# Patient Record
Sex: Female | Born: 1935 | Hispanic: No | Marital: Married | Smoking: Never smoker
Health system: Southern US, Community
[De-identification: ages and names within clinical notes are randomized; demographics above are authoritative.]

## PROBLEM LIST (undated history)

## (undated) DIAGNOSIS — I10 Essential (primary) hypertension: Secondary | ICD-10-CM

## (undated) HISTORY — PX: OTHER SURGICAL HISTORY: SHX169

## (undated) HISTORY — PX: EYE SURGERY: SHX253

---

## 2010-04-23 ENCOUNTER — Encounter
Admission: RE | Admit: 2010-04-23 | Discharge: 2010-04-23 | Payer: Self-pay | Source: Home / Self Care | Attending: Specialist | Admitting: Specialist

## 2015-05-28 DIAGNOSIS — H409 Unspecified glaucoma: Secondary | ICD-10-CM | POA: Insufficient documentation

## 2016-05-26 DIAGNOSIS — E7800 Pure hypercholesterolemia, unspecified: Secondary | ICD-10-CM | POA: Insufficient documentation

## 2016-05-26 DIAGNOSIS — I10 Essential (primary) hypertension: Secondary | ICD-10-CM | POA: Insufficient documentation

## 2017-07-20 DIAGNOSIS — R519 Headache, unspecified: Secondary | ICD-10-CM | POA: Insufficient documentation

## 2018-03-20 DIAGNOSIS — H401111 Primary open-angle glaucoma, right eye, mild stage: Secondary | ICD-10-CM | POA: Insufficient documentation

## 2018-03-20 DIAGNOSIS — H401123 Primary open-angle glaucoma, left eye, severe stage: Secondary | ICD-10-CM | POA: Insufficient documentation

## 2018-03-21 ENCOUNTER — Encounter (HOSPITAL_COMMUNITY): Payer: Self-pay

## 2018-03-21 ENCOUNTER — Emergency Department (HOSPITAL_COMMUNITY)
Admission: EM | Admit: 2018-03-21 | Discharge: 2018-03-21 | Disposition: A | Discharge status: Home / Self Care | Payer: Medicare Other | Attending: Emergency Medicine | Admitting: Emergency Medicine

## 2018-03-21 ENCOUNTER — Emergency Department (HOSPITAL_COMMUNITY): Payer: Medicare Other

## 2018-03-21 ENCOUNTER — Other Ambulatory Visit: Payer: Self-pay

## 2018-03-21 DIAGNOSIS — Y939 Activity, unspecified: Secondary | ICD-10-CM | POA: Diagnosis not present

## 2018-03-21 DIAGNOSIS — S4991XA Unspecified injury of right shoulder and upper arm, initial encounter: Secondary | ICD-10-CM | POA: Diagnosis present

## 2018-03-21 DIAGNOSIS — I10 Essential (primary) hypertension: Secondary | ICD-10-CM | POA: Insufficient documentation

## 2018-03-21 DIAGNOSIS — S42291A Other displaced fracture of upper end of right humerus, initial encounter for closed fracture: Secondary | ICD-10-CM

## 2018-03-21 DIAGNOSIS — Y999 Unspecified external cause status: Secondary | ICD-10-CM | POA: Insufficient documentation

## 2018-03-21 DIAGNOSIS — W010XXA Fall on same level from slipping, tripping and stumbling without subsequent striking against object, initial encounter: Secondary | ICD-10-CM | POA: Insufficient documentation

## 2018-03-21 DIAGNOSIS — S42201A Unspecified fracture of upper end of right humerus, initial encounter for closed fracture: Secondary | ICD-10-CM | POA: Insufficient documentation

## 2018-03-21 DIAGNOSIS — Y929 Unspecified place or not applicable: Secondary | ICD-10-CM | POA: Diagnosis not present

## 2018-03-21 HISTORY — DX: Essential (primary) hypertension: I10

## 2018-03-21 MED ORDER — MORPHINE SULFATE 15 MG PO TABS: 7.5000 mg | ORAL_TABLET | ORAL | 0 refills | Status: DC | PRN

## 2018-03-21 NOTE — ED Triage Notes (Signed)
Patient tripped and fell 3 days ago.Patient went to an UC yesterday with c/o right shoulder pain. Patient had an x-ray and the family was informed that she had a fracture to the right shoulder. Patient's family was told to bring the patient to the ED due to pain. Patient currently has a right arm sling.

## 2018-03-21 NOTE — Discharge Instructions (Signed)
Take tylenol 1000mg(2 extra strength) four times a day.  ° °Then take the pain medicine if you feel like you need it. Narcotics do not help with the pain, they only make you care about it less.  You can become addicted to this, people may break into your house to steal it.  It will constipate you.  If you drive under the influence of this medicine you can get a DUI.   ° °

## 2018-03-21 NOTE — ED Provider Notes (Signed)
[facility]-EMERGENCY DEPT Provider Note   CSN: [id number] Arrival date & time: 03/21/18  1205     History   Chief Complaint Chief Complaint  Patient presents with  . Fall  . Shoulder Pain    HPI Jo Brown is a 84 y.o. female.  84 yo F with a cc of a fall.  Tripped over something on the ground and landed on her right shoulder.  Occurred 3 days ago.  Pain and swelling since.  Went to urgent care today with proximal humeral fx and sent here for evaluation.   The history is provided by the patient. A language interpreter was used.  Fall  This is a new problem. The current episode started 2 days ago. The problem occurs constantly. The problem has not changed since onset.Pertinent negatives include no chest pain, no abdominal pain, no headaches and no shortness of breath. Nothing relieves the symptoms. She has tried nothing for the symptoms. The treatment provided no relief.  Shoulder Pain      Past Medical History:  Diagnosis Date  . Hypertension     There are no active problems to display for this patient.   Past Surgical History:  Procedure Laterality Date  . EYE SURGERY       OB History   None      Home Medications    Prior to Admission medications   Medication Sig Start Date End Date Taking? Authorizing Provider  morphine (MSIR) 15 MG tablet Take 0.5 tablets (7.5 mg total) by mouth every 4 (four) hours as needed for severe pain. 03/21/18   [name], DO    Family History History reviewed. No pertinent family history.  Social History Social History   Tobacco Use  . Smoking status: Never Smoker  . Smokeless tobacco: Never Used  Substance Use Topics  . Alcohol use: Never    Frequency: Never  . Drug use: Never     Allergies   Patient has no known allergies.   Review of Systems Review of Systems  Constitutional: Negative for chills and fever.  HENT: Negative for congestion and rhinorrhea.   Eyes: Negative for redness and  visual disturbance.  Respiratory: Negative for shortness of breath and wheezing.   Cardiovascular: Negative for chest pain and palpitations.  Gastrointestinal: Negative for abdominal pain, nausea and vomiting.  Genitourinary: Negative for dysuria and urgency.  Musculoskeletal: Positive for arthralgias and myalgias.  Skin: Negative for pallor and wound.  Neurological: Negative for dizziness and headaches.     Physical Exam Updated Vital Signs BP (!) 145/81   Pulse 83   Temp 98.4 F (36.9 C) (Oral)   Resp 17   Ht 5\' 3"  (1.6 m)   Wt 59 kg   SpO2 100%   BMI 23.03 kg/m   Physical Exam  Constitutional: She is oriented to person, place, and time. She appears well-developed and well-nourished. No distress.  HENT:  Head: Normocephalic and atraumatic.  Eyes: Pupils are equal, round, and reactive to light. EOM are normal.  Neck: Normal range of motion. Neck supple.  Cardiovascular: Normal rate and regular rhythm. Exam reveals no gallop and no friction rub.  No murmur heard. Pulmonary/Chest: Effort normal. She has no wheezes. She has no rales.  Abdominal: Soft. She exhibits no distension. There is no tenderness.  Musculoskeletal: She exhibits edema and tenderness.  Edema and bruising to the right arm down to the forearm.  Full range of motion of the elbow pain at the proximal humerus.  Pulse motor and sensation is intact distally.  Neurological: She is alert and oriented to person, place, and time.  Skin: Skin is warm and dry. She is not diaphoretic.  Psychiatric: She has a normal mood and affect. Her behavior is normal.  Nursing note and vitals reviewed.    ED Treatments / Results  Labs (all labs ordered are listed, but only abnormal results are displayed) Labs Reviewed - No data to display  EKG None  Radiology Dg Shoulder Right  Result Date: 03/21/2018 CLINICAL DATA:  Tripped and fell 3 days ago, right shoulder pain EXAM: RIGHT SHOULDER - 2+ VIEW COMPARISON:  None.  FINDINGS: There is impacted displaced fracture of the right humeral neck with avulsion of the right greater tuberosity. Also, there is slight subluxation of the right humeral head from the glenoid fossa. Right clavicle appears intact. The ribs that are visualized are intact. The bones are somewhat osteopenic. IMPRESSION: 1. Impacted angulated fracture of the right humeral neck with slight caudal subluxation. 2. Avulsion of the right humeral greater tuberosity. Electronically Signed   By: Dwyane DeePaul  Barry M.D.   On: 03/21/2018 13:26    Procedures Procedures (including critical care time)  Medications Ordered in ED Medications - No data to display   Initial Impression / Assessment and Plan / ED Course  I have reviewed the triage vital signs and the nursing notes.  Pertinent labs & imaging results that were available during my care of the patient were reviewed by me and considered in my medical decision making (see chart for details).     84 yo F with a chief complaint of a proximal humerus fracture.  Plain films viewed by me is possible dislocation of the humeral head will discuss with the orthopedist.  I discussed case with Dr. Despina HickAlusio, he independently reviewed the images and thought that the subluxation was likely due to an intra-articular hematoma.  He recommended a shoulder immobilizer and follow-up in the office.  4:28 PM:  I have discussed the diagnosis/risks/treatment options with the patient and family and believe the pt to be eligible for discharge home to follow-up with Ortho. We also discussed returning to the ED immediately if new or worsening sx occur. We discussed the sx which are most concerning (e.g., sudden worsening pain, fever, inability to tolerate by mouth) that necessitate immediate return. Medications administered to the patient during their visit and any new prescriptions provided to the patient are listed below.  Medications given during this visit Medications - No data to  display    The patient appears reasonably screen and/or stabilized for discharge and I doubt any other medical condition or other [facility] requiring further screening, evaluation, or treatment in the ED at this time prior to discharge.    Final Clinical Impressions(s) / ED Diagnoses   Final diagnoses:  Other closed displaced fracture of proximal end of right humerus, initial encounter    ED Discharge Orders         Ordered    morphine (MSIR) 15 MG tablet  Every 4 hours PRN     03/21/18 1627           Melene PlanFloyd, Zackary Mckeone, DO 03/21/18 1628

## 2018-04-05 DIAGNOSIS — M25511 Pain in right shoulder: Secondary | ICD-10-CM | POA: Insufficient documentation

## 2018-04-20 ENCOUNTER — Other Ambulatory Visit: Payer: Self-pay

## 2018-04-20 ENCOUNTER — Ambulatory Visit: Payer: Medicare Other | Attending: Orthopedic Surgery

## 2018-04-20 DIAGNOSIS — M25511 Pain in right shoulder: Secondary | ICD-10-CM | POA: Diagnosis present

## 2018-04-20 DIAGNOSIS — M6281 Muscle weakness (generalized): Secondary | ICD-10-CM | POA: Diagnosis present

## 2018-04-20 DIAGNOSIS — M25611 Stiffness of right shoulder, not elsewhere classified: Secondary | ICD-10-CM | POA: Insufficient documentation

## 2018-04-20 NOTE — Therapy (Signed)
[facility] [address] [place], [place], [address] Phone: [phone]   Fax:  [phone]  Physical Therapy Evaluation  Patient Details  Name: Jo Brown MRN: [id number] Date of Birth: 26-Aug-1933 Referring Provider (PT): Duwayne HeckJason Rogers, MD   Encounter Date: 04/20/2018  PT End of Session - 04/20/18 1341    Visit Number  1    Number of Visits  18    Date for PT Re-Evaluation  06/23/18    Authorization Type  MCD    PT Start Time  0133    PT Stop Time  0214    PT Time Calculation (min)  41 min    Activity Tolerance  Patient tolerated treatment well;Patient limited by pain    Behavior During Therapy  [facility] for tasks assessed/performed       History reviewed. No pertinent past medical history.  History reviewed. No pertinent surgical history.  There were no vitals filed for this visit.   Subjective Assessment - 04/20/18 1329    Subjective  RT arm pain.  Due to fracture from fall.    Tripped at home .     Patient is accompained by:  Family member   daughter anbd sister in law   Limitations  Lifting   not using  arm for self care and home tasks   Currently in Pain?  Yes    Pain Score  5     Pain Location  Shoulder    Pain Orientation  Right   prox humerous   Pain Type  --   sub acute   Pain Onset  More than a month ago    Pain Frequency  Constant    Aggravating Factors   moving RT arm    Pain Relieving Factors  rest,  meds          [facility] PT Assessment - 04/20/18 0001      Assessment   Medical Diagnosis  Fx Rt humerous    Referring Provider (PT)  Duwayne HeckJason Rogers, MD    Onset Date/Surgical Date  03/18/18    Hand Dominance  Right    Next MD Visit  04/1018    Prior Therapy  no      Precautions   Precaution Comments  PROM only shoulder      Restrictions   Other Position/Activity Restrictions  no weight bearing      Balance Screen   Has the patient fallen in the past 6 months  Yes      Home Environment   Living  Environment  Private residence    Living Arrangements  Children    Available Help at Discharge  Family    Type of Home  House      Prior Function   Level of Independence  Needs assistance with ADLs;Needs assistance with homemaking    Vocation  Unemployed      Cognition   Overall Cognitive Status  Within Functional Limits for tasks assessed    Area of Impairment  --      ROM / Strength   AROM / PROM / Strength  PROM;AROM      AROM   AROM Assessment Site  Shoulder    Right/Left Shoulder  Left    Left Shoulder Flexion  134 Degrees    Left Shoulder ABduction  145 Degrees    Left Shoulder Internal Rotation  60 Degrees    Left Shoulder External Rotation  90 Degrees    Left Shoulder Horizontal ABduction  25 Degrees    Left Shoulder Horizontal ADduction  103 Degrees      PROM   Overall PROM Comments  RT elbow flexion 135  ext - 4-    PROM Assessment Site  Shoulder    Right/Left Shoulder  Right    Right Shoulder Extension  25 Degrees    Right Shoulder Flexion  90 Degrees    Right Shoulder ABduction  70 Degrees    Right Shoulder Internal Rotation  70 Degrees    Right Shoulder External Rotation  25 Degrees    Right Shoulder Horizontal ABduction  5 Degrees    Right Shoulder Horizontal  ADduction  40 Degrees                Objective measurements completed on examination: See above findings.              PT Education - 04/20/18 1329    Education Details  POC, elevation on pillows and STW , active elbow ROM,       Person(s) Educated  Patient;Caregiver(s)    Methods  Explanation;Demonstration;Verbal cues;Tactile cues    Comprehension  Verbalized understanding;Returned demonstration       PT Short Term Goals - 04/20/18 1418      PT SHORT TERM GOAL #1   Title  She will be independnet with initial HEP    Baseline  no program and only PROM    Time  2    Period  Weeks    Status  New      PT SHORT TERM GOAL #2   Title  Passive flexion to 105 degrees      Baseline  90 degreees at eval    Time  2    Period  Weeks    Status  New      PT SHORT TERM GOAL #3   Title  passive abduction  to 95 degrees     Baseline  70 at eval    Time  2    Period  Weeks    Status  New        PT Long Term Goals - 04/20/18 1420      PT LONG TERM GOAL #1   Title  She will be independent with all HEP issued    Baseline  independnet with iniitial HEP    Time  8    Period  Weeks    Status  New      PT LONG TERM GOAL #2   Title  She will be able to do all self care with RT are as normal with mild pain    Baseline  unable to do self care with RT arm    Time  8    Period  Weeks    Status  New      PT LONG TERM GOAL #3   Title  She will return to  home tasks with cooking and cleaning with light to moderate tasks with 3/10 max pain    Baseline  Not doing home tasks due to pain    Time  8    Period  Weeks    Status  New      PT LONG TERM GOAL #4   Title  She will demo active ROM  with 3/10 max pain  over hea d with lifting 3 pounds to cabinet    Baseline  unable to lift weight    Time  8    Period  Weeks  Status  New             Plan - 04/20/18 1413    Clinical Impression Statement  Ms Jo CollinsLim presents with RT shoulder pain and limited motion due to fracture from trip and fall . She is limited to PROM.  She should progress with skilled PT return to use of Rt arm for self care and home tasks    Clinical Presentation  Stable    Clinical Decision Making  Low    Rehab Potential  Good    PT Frequency  --   3 visits for 2 weeks   PT Duration  2 weeks   then 2x/week for 6 weeks   PT Treatment/Interventions  Passive range of motion;Taping;Manual techniques;Patient/family education;Therapeutic exercise;Therapeutic activities;Cryotherapy;Moist Heat    PT Next Visit Plan  Review HEp , manul for STW and ROM PROM until MD clears for different ROM. modalities as needed    PT Home Exercise Plan  Support arm at rest above heart if able ,   active elbow FLex  and ext.   family STW to tolerance    Consulted and Agree with Plan of Care  Family member/caregiver;Patient    Family Member Consulted  siter and sister in law       Patient will benefit from skilled therapeutic intervention in order to improve the following deficits and impairments:  Pain, Impaired UE functional use, Decreased strength, Decreased range of motion, Increased muscle spasms  Visit Diagnosis: Right shoulder pain, unspecified chronicity  Stiffness of right shoulder, not elsewhere classified  Muscle weakness (generalized)     Problem List There are no active problems to display for this patient.   Caprice RedChasse, Aviyanna Colbaugh M  PT 04/20/2018, 2:24 PM  [facility] [address] [place], [place], [address] Phone: [phone]   Fax:  [phone]  Name: Jo Brown MRN: [id number] Date of Birth: 07-15-1933

## 2018-05-03 ENCOUNTER — Encounter: Payer: Self-pay | Admitting: Physical Therapy

## 2018-05-03 ENCOUNTER — Ambulatory Visit: Payer: Medicare Other | Admitting: Physical Therapy

## 2018-05-03 DIAGNOSIS — M6281 Muscle weakness (generalized): Secondary | ICD-10-CM

## 2018-05-03 DIAGNOSIS — M25511 Pain in right shoulder: Secondary | ICD-10-CM

## 2018-05-03 DIAGNOSIS — M25611 Stiffness of right shoulder, not elsewhere classified: Secondary | ICD-10-CM

## 2018-05-03 NOTE — Therapy (Signed)
[facility] [address] [place], [place], [address] Phone: [phone]   Fax:  [phone]  Physical Therapy Treatment  Patient Details  Name: Jo MillerHye Sook Zarcone MRN: [id number] Date of Birth: 1934-01-03 Referring Provider (PT): Duwayne HeckJason Rogers, MD   Encounter Date: 05/03/2018  PT End of Session - 05/03/18 1020    Visit Number  2    Number of Visits  18    Date for PT Re-Evaluation  06/23/18    Authorization Type  medicare/medicaid     PT Start Time  1015    PT Stop Time  1055    PT Time Calculation (min)  40 min       Past Medical History:  Diagnosis Date  . Hypertension     Past Surgical History:  Procedure Laterality Date  . EYE SURGERY      There were no vitals filed for this visit.  Subjective Assessment - 05/03/18 1019    Subjective  Pain when I move    Patient is accompained by:  Family member;Interpreter    Currently in Pain?  Yes    Pain Score  4    with movement    Pain Location  Arm    Pain Orientation  Right;Proximal    Aggravating Factors   moving    Pain Relieving Factors  rest         [facility] PT Assessment - 05/03/18 0001      PROM   Right Shoulder Flexion  95 Degrees    Right Shoulder ABduction  75 Degrees    Right Shoulder Internal Rotation  70 Degrees    Right Shoulder External Rotation  15 Degrees                   [facility] Adult PT Treatment/Exercise - 05/03/18 0001      Exercises   Exercises  Shoulder      Shoulder Exercises: Seated   Retraction  10 reps    Other Seated Exercises  shoulder rolls     Other Seated Exercises  AROM bicep curls , towel grip x 10      Shoulder Exercises: Stretch   Other Shoulder Stretches  upper trap stretch for right       Manual Therapy   Manual Therapy  Soft tissue mobilization;Joint mobilization;Passive ROM    Joint Mobilization  gentle oscillations A/P, P/A and inferior for relaxation    Soft tissue mobilization  IASTM to upper arm  anterior, lateral, posterior     Passive ROM  Flexion , abduction, ER , IR                PT Short Term Goals - 04/20/18 1418      PT SHORT TERM GOAL #1   Title  She will be independnet with initial HEP    Baseline  no program and only PROM    Time  2    Period  Weeks    Status  New      PT SHORT TERM GOAL #2   Title  Passive flexion to 105 degrees     Baseline  90 degreees at eval    Time  2    Period  Weeks    Status  New      PT SHORT TERM GOAL #3   Title  passive abduction  to 95 degrees     Baseline  70 at eval    Time  2  Period  Weeks    Status  New        PT Long Term Goals - 04/20/18 1420      PT LONG TERM GOAL #1   Title  She will be independent with all HEP issued    Baseline  independnet with iniitial HEP    Time  8    Period  Weeks    Status  New      PT LONG TERM GOAL #2   Title  She will be able to do all self care with RT are as normal with mild pain    Baseline  unable to do self care with RT arm    Time  8    Period  Weeks    Status  New      PT LONG TERM GOAL #3   Title  She will return to  home tasks with cooking and cleaning with light to moderate tasks with 3/10 max pain    Baseline  Not doing home tasks due to pain    Time  8    Period  Weeks    Status  New      PT LONG TERM GOAL #4   Title  She will demo active ROM  with 3/10 max pain  over hea d with lifting 3 pounds to cabinet    Baseline  unable to lift weight    Time  8    Period  Weeks    Status  New            Plan - 05/03/18 1107    Clinical Impression Statement  Pt reports seeing MD and he said she was healing well. She does not have any written instructions for PT at this time. Began with shoulder rolls and upper trap stretches. Performed gentle GHJ oscillations to decrease tension and promote relaxation for PROM. PROM slighty improved for flexion and abduction. ER very painful at end range. IASTm and soft tissue work to upper arm at end of session for  pain relief. Encouraged HMP for tissue relaxation at home. Sh reports difficulty with gripping at home. Began towel squeezes.     PT Next Visit Plan  Review HEp , manul for STW and ROM PROM until MD clears for different ROM. modalities as needed    PT Home Exercise Plan  Support arm at rest above heart if able ,   active elbow FLex and ext.   family STW to tolerance    Consulted and Agree with Plan of Care  Family member/caregiver;Patient    Family Member Consulted  siter and sister in law       Patient will benefit from skilled therapeutic intervention in order to improve the following deficits and impairments:  Pain, Impaired UE functional use, Decreased strength, Decreased range of motion, Increased muscle spasms  Visit Diagnosis: Right shoulder pain, unspecified chronicity  Stiffness of right shoulder, not elsewhere classified  Muscle weakness (generalized)     Problem List There are no active problems to display for this patient.   Jo Brown, [name] 05/03/2018, 11:22 AM  [facility] [address] [place], [place], [address] Phone: [phone]   Fax:  [phone]  Name: Jo Brown MRN: [id number] Date of Birth: 1933/06/30

## 2018-05-05 ENCOUNTER — Encounter: Payer: Self-pay | Admitting: Physical Therapy

## 2018-05-05 ENCOUNTER — Ambulatory Visit: Payer: Medicare Other | Admitting: Physical Therapy

## 2018-05-05 DIAGNOSIS — M6281 Muscle weakness (generalized): Secondary | ICD-10-CM

## 2018-05-05 DIAGNOSIS — M25611 Stiffness of right shoulder, not elsewhere classified: Secondary | ICD-10-CM

## 2018-05-05 DIAGNOSIS — M25511 Pain in right shoulder: Secondary | ICD-10-CM | POA: Diagnosis not present

## 2018-05-05 NOTE — Therapy (Signed)
[facility] [address] [place], [place], [address] Phone: [phone]   Fax:  [phone]  Physical Therapy Treatment  Patient Details  Name: Jo Brown MRN: [id number] Date of Birth: Jan 28, 1934 Referring Provider (PT): Duwayne Heck, MD   Encounter Date: 05/05/2018  PT End of Session - 05/05/18 1112    Visit Number  3    Number of Visits  18    Date for PT Re-Evaluation  06/23/18    Authorization Type  medicare/medicaid     PT Start Time  1015    PT Stop Time  1055    PT Time Calculation (min)  40 min    Activity Tolerance  Patient tolerated treatment well;Patient limited by pain    Behavior During Therapy  [facility] for tasks assessed/performed       Past Medical History:  Diagnosis Date  . Hypertension     Past Surgical History:  Procedure Laterality Date  . EYE SURGERY      There were no vitals filed for this visit.  Subjective Assessment - 05/05/18 1102    Subjective  Pt reports feeling better after last tx and has no pain today.    Currently in Pain?  No/denies         [facility] PT Assessment - 05/05/18 0001      PROM   Right Shoulder Flexion  110 Degrees    Right Shoulder ABduction  95 Degrees                   [facility] Adult PT Treatment/Exercise - 05/05/18 0001      Shoulder Exercises: Stretch   Other Shoulder Stretches  upper trap stretch for right       Manual Therapy   Manual Therapy  Soft tissue mobilization;Passive ROM;Joint mobilization    Joint Mobilization  gentle oscillations for relaxation    Soft tissue mobilization  IASTM to upper arm anterior, lateral, posterior     Passive ROM  Flexion , abduction, ER , IR                PT Short Term Goals - 04/20/18 1418      PT SHORT TERM GOAL #1   Title  She will be independnet with initial HEP    Baseline  no program and only PROM    Time  2    Period  Weeks    Status  New      PT SHORT TERM GOAL #2   Title  Passive  flexion to 105 degrees     Baseline  90 degreees at eval    Time  2    Period  Weeks    Status  New      PT SHORT TERM GOAL #3   Title  passive abduction  to 95 degrees     Baseline  70 at eval    Time  2    Period  Weeks    Status  New        PT Long Term Goals - 04/20/18 1420      PT LONG TERM GOAL #1   Title  She will be independent with all HEP issued    Baseline  independnet with iniitial HEP    Time  8    Period  Weeks    Status  New      PT LONG TERM GOAL #2   Title  She will be able to do all self  care with RT are as normal with mild pain    Baseline  unable to do self care with RT arm    Time  8    Period  Weeks    Status  New      PT LONG TERM GOAL #3   Title  She will return to  home tasks with cooking and cleaning with light to moderate tasks with 3/10 max pain    Baseline  Not doing home tasks due to pain    Time  8    Period  Weeks    Status  New      PT LONG TERM GOAL #4   Title  She will demo active ROM  with 3/10 max pain  over hea d with lifting 3 pounds to cabinet    Baseline  unable to lift weight    Time  8    Period  Weeks    Status  New            Plan - 05/05/18 1205    Clinical Impression Statement  Pt reports feeling better after last treatment and has no pain today. Began with upper trap stretching to relax before PROM. Gentle oscillations were performed during PROM to relax and decrease muscle guarding. Was able to increase shoulder flexion from 95 to 110 degrees and abduction from 75 to 95 degrees in comparison to last visit. Reminded pt and siste that she should still not be actively using her right arm. Pt stated feeling better after tx and is progressing.     PT Treatment/Interventions  Passive range of motion;Taping;Manual techniques;Patient/family education;Therapeutic exercise;Therapeutic activities;Cryotherapy;Moist Heat    PT Next Visit Plan  Review HEp , manul for STW and ROM PROM until MD clears for different ROM.  modalities as needed    PT Home Exercise Plan  Support arm at rest above heart if able ,   active elbow FLex and ext.   family STW to tolerance    Consulted and Agree with Plan of Care  Family member/caregiver;Patient    Family Member Consulted  siter and sister in law      During this treatment session, the therapist was present, participating in and directing the treatment.  Patient will benefit from skilled therapeutic intervention in order to improve the following deficits and impairments:  Pain, Impaired UE functional use, Decreased strength, Decreased range of motion, Increased muscle spasms  Visit Diagnosis: Right shoulder pain, unspecified chronicity  Stiffness of right shoulder, not elsewhere classified  Muscle weakness (generalized)     Problem List There are no active problems to display for this patient.   Royetta AsalHeather Radie Berges, SPTA 05/05/2018, 12:17 PM   Jannette SpannerJessica Donoho, PTA 05/05/18 12:31 PM Phone: [phone] Fax: [phone]  [facility] [address] [place], [place], [address] Phone: [phone]   Fax:  [phone]  Name: Percell MillerHye Sook Holtsclaw MRN: [id number] Date of Birth: [birth year]

## 2018-05-08 ENCOUNTER — Other Ambulatory Visit: Payer: Self-pay

## 2018-05-08 ENCOUNTER — Ambulatory Visit: Payer: Medicare Other | Admitting: Physical Therapy

## 2018-05-08 ENCOUNTER — Encounter: Payer: Self-pay | Admitting: Physical Therapy

## 2018-05-08 DIAGNOSIS — M25511 Pain in right shoulder: Secondary | ICD-10-CM

## 2018-05-08 DIAGNOSIS — M25611 Stiffness of right shoulder, not elsewhere classified: Secondary | ICD-10-CM

## 2018-05-08 DIAGNOSIS — M6281 Muscle weakness (generalized): Secondary | ICD-10-CM

## 2018-05-08 NOTE — Therapy (Signed)
[facility] [address] [place], [place], [address] Phone: [phone]   Fax:  [phone]  Physical Therapy Treatment  Patient Details  Name: Jo MillerHye Sook Brown MRN: [id number] Date of Birth: [birth year] Referring Provider (PT): Duwayne HeckJason Rogers, MD   Encounter Date: 05/08/2018  PT End of Session - 05/08/18 0757    Visit Number  4    Number of Visits  18    Date for PT Re-Evaluation  06/23/18    Authorization Type  medicare/medicaid     PT Start Time  0800    PT Stop Time  0840    PT Time Calculation (min)  40 min    Activity Tolerance  Patient tolerated treatment well    Behavior During Therapy  [facility] for tasks assessed/performed       Past Medical History:  Diagnosis Date  . Hypertension     Past Surgical History:  Procedure Laterality Date  . EYE SURGERY      There were no vitals filed for this visit.  Subjective Assessment - 05/08/18 0806    Subjective  Pt  is not taking as much medicine so she is hurting more.    Limitations  Lifting    Currently in Pain?  Yes   at rest only with movement   Pain Score  5    with movement and no medicine   Pain Location  Arm    Pain Orientation  Right;Proximal    Pain Descriptors / Indicators  Sore;Heaviness    Pain Type  --   sub acute   Pain Onset  More than a month ago    Pain Frequency  Intermittent    Aggravating Factors   when movinig arm         [facility] PT Assessment - 05/08/18 0833      PROM   Overall PROM   Deficits    Right Shoulder Flexion  130 Degrees    Right Shoulder ABduction  101 Degrees   ERP   Right Shoulder Internal Rotation  70 Degrees   45 degree abduction   Right Shoulder External Rotation  30 Degrees   ERP                  [facility] Adult PT Treatment/Exercise - 05/08/18 0811      Shoulder Exercises: Stretch   Other Shoulder Stretches  upper trap stretch for right       Manual Therapy   Manual Therapy  Soft tissue mobilization;Passive  ROM;Joint mobilization    Joint Mobilization  gentle oscillations for relaxation    Soft tissue mobilization  IASTM to upper arm anterior, lateral, posterior , right pectoral, latissimus, teres major    Passive ROM  Flexion , abduction, ER , IR                PT Short Term Goals - 05/08/18 [id number]      PT SHORT TERM GOAL #1   Title  She will be independnet with initial HEP    Baseline  no program and only PROM. MD called 05-07-18 to see get permission to proceed with AAROM    Time  2    Period  Weeks    Status  On-going      PT SHORT TERM GOAL #2   Title  Passive flexion to 105 degrees     Baseline  90 degreees at eval   05-07-18 PROM to 130 flex, 101 abd    Period  Weeks    Status  Achieved     PT SHORT TERM GOAL #3   Title  passive abduction  to 95 degrees     Baseline  101 05-07-18    Time  2    Period  Weeks    Status  Achieved        PT Long Term Goals - 04/20/18 1420      PT LONG TERM GOAL #1   Title  She will be independent with all HEP issued    Baseline  independnet with iniitial HEP    Time  8    Period  Weeks    Status  New      PT LONG TERM GOAL #2   Title  She will be able to do all self care with RT are as normal with mild pain    Baseline  unable to do self care with RT arm    Time  8    Period  Weeks    Status  New      PT LONG TERM GOAL #3   Title  She will return to  home tasks with cooking and cleaning with light to moderate tasks with 3/10 max pain    Baseline  Not doing home tasks due to pain    Time  8    Period  Weeks    Status  New      PT LONG TERM GOAL #4   Title  She will demo active ROM  with 3/10 max pain  over hea d with lifting 3 pounds to cabinet    Baseline  unable to lift weight    Time  8    Period  Weeks    Status  New            Plan - 05/08/18 0848    Clinical Impression Statement  Pt reports feeling 5/10 pain today with movement but no pain at rest.  Pt is not taking medication.  Pt with increased muscle  gaurding iniitally.  Shouder flex PROM 130 flex. abd  101 and  IR 70 at 45 degree abd and 30 degrees ER with 45 degree shld abd.  worked on soft tisssue mobilization of latissimus, pectoral . traps and peri scapular musculature.  Achieved STG #2 and # 3    Rehab Potential  Good    PT Treatment/Interventions  Passive range of motion;Taping;Manual techniques;Patient/family education;Therapeutic exercise;Therapeutic activities;Cryotherapy;Moist Heat    PT Next Visit Plan  Review HEp , manul for STW and ROM PROM until MD clears for different ROM. modalities as needed called MD 05-07-18 to fax progression RX    PT Home Exercise Plan  Support arm at rest above heart if able ,   active elbow FLex and ext.   family STW to tolerance    Consulted and Agree with Plan of Care  Family member/caregiver;Patient       Patient will benefit from skilled therapeutic intervention in order to improve the following deficits and impairments:  Pain, Impaired UE functional use, Decreased strength, Decreased range of motion, Increased muscle spasms  Visit Diagnosis: Right shoulder pain, unspecified chronicity  Stiffness of right shoulder, not elsewhere classified  Muscle weakness (generalized)     Problem List There are no active problems to display for this patient.  Garen Lah, PT Certified Exercise Expert for the Aging Adult  05/08/18 9:10 AM Phone: [phone] Fax: [phone]  [facility] [address]  [place], [place], [address] Phone: [phone]   Fax:  [phone]  Name: Jo Brown MRN: [id number] Date of Birth: 08/09/1933

## 2018-05-11 ENCOUNTER — Ambulatory Visit: Payer: Medicare Other | Admitting: Physical Therapy

## 2018-05-11 ENCOUNTER — Encounter: Payer: Self-pay | Admitting: Physical Therapy

## 2018-05-11 DIAGNOSIS — M25611 Stiffness of right shoulder, not elsewhere classified: Secondary | ICD-10-CM

## 2018-05-11 DIAGNOSIS — M25511 Pain in right shoulder: Secondary | ICD-10-CM

## 2018-05-11 DIAGNOSIS — M6281 Muscle weakness (generalized): Secondary | ICD-10-CM

## 2018-05-11 NOTE — Patient Instructions (Addendum)
When Exercising . ALWAYS point your thumb up or out.     Finger Flexors  Keeping right fingertips straight, press putty toward base of palm. Repeat __20__ times. Do __3__ sessions per day. Activity: Squeeze flour sifter, plastic squeeze bottles, [place] baster, juice from fruit.*  AROM: Elbow Flexion / Extension  With left hand palm up, gently bend elbow as far as possible. Then straighten arm as far as possible. Repeat __10__ times per set. Do __3__ sessions per day.  SHOULDER: Flexion On Table  Place hands on table, elbows straight. Move hips away from body. Press hands down into table. Hold _3__ seconds. _10__ reps per set, __3_ sets per day. Posture - Sitting   Sit upright, head facing forward. Try using a roll to support lower back. Keep shoulders relaxed, and avoid rounded back. Keep hips level with knees. Avoid crossing legs for long periods.   PT draw picture of  Left elbow resting table and Active Range of Motion in pain free motion of supination and pronation 10 x  3 x a day                  Copyright  VHI. All rights reserved.   Cryotherapy  ICE 20 mins at most, 3 times a day following exercises.   Cryotherapy means treatment with cold. Ice or gel packs can be used to reduce both pain and swelling. Ice is the most helpful within the first 24 to 48 hours after an injury or flare-up from overusing a muscle or joint. Sprains, strains, spasms, burning pain, shooting pain, and aches can all be eased with ice. Ice can also be used when recovering from surgery. Ice is effective, has very few side effects, and is safe for most people to use. PRECAUTIONS  Ice is not a safe treatment option for people with:  Raynaud phenomenon. This is a condition affecting small blood vessels in the extremities. Exposure to cold may cause your problems to return.  Cold hypersensitivity. There are many forms of cold hypersensitivity, including:  Cold urticaria. Red, itchy  hives appear on the skin when the tissues begin to warm after being iced.  Cold erythema. This is a red, itchy rash caused by exposure to cold.  Cold hemoglobinuria. Red blood cells break down when the tissues begin to warm after being iced. The hemoglobin that carry oxygen are passed into the urine because they cannot combine with blood proteins fast enough.  Numbness or altered sensitivity in the area being iced. If you have any of the following conditions, do not use ice until you have discussed cryotherapy with your caregiver:  Heart conditions, such as arrhythmia, angina, or chronic heart disease.  High blood pressure.  Healing wounds or open skin in the area being iced.  Current infections.  Rheumatoid arthritis.  Poor circulation.  Diabetes. Ice slows the blood flow in the region it is applied. This is beneficial when trying to stop inflamed tissues from spreading irritating chemicals to surrounding tissues. However, if you expose your skin to cold temperatures for too long or without the proper protection, you can damage your skin or nerves. Watch for signs of skin damage due to cold. HOME CARE INSTRUCTIONS Follow these tips to use ice and cold packs safely.  Place a dry or damp towel between the ice and skin. A damp towel will cool the skin more quickly, so you may need to shorten the time that the ice is used.  For a more rapid  response, add gentle compression to the ice.  Ice for no more than 20 minutes at a time. The bonier the area you are icing, the less time it will take to get the benefits of ice.  Check your skin after 5 minutes to make sure there are no signs of a poor response to cold or skin damage.  Rest 20 minutes or more between uses.  Once your skin is numb, you can end your treatment. You can test numbness by very lightly touching your skin. The touch should be so light that you do not see the skin dimple from the pressure of your fingertip. When using  ice, most people will feel these normal sensations in this order: cold, burning, aching, and numbness.  Do not use ice on someone who cannot communicate their responses to pain, such as small children or people with dementia. HOW TO MAKE AN ICE PACK Ice packs are the most common way to use ice therapy. Other methods include ice massage, ice baths, and cryosprays. Muscle creams that cause a cold, tingly feeling do not offer the same benefits that ice offers and should not be used as a substitute unless recommended by your caregiver. To make an ice pack, do one of the following:  Place crushed ice or a bag of frozen vegetables in a sealable plastic bag. Squeeze out the excess air. Place this bag inside another plastic bag. Slide the bag into a pillowcase or place a damp towel between your skin and the bag.  Mix 3 parts water with 1 part rubbing alcohol. Freeze the mixture in a sealable plastic bag. When you remove the mixture from the freezer, it will be slushy. Squeeze out the excess air. Place this bag inside another plastic bag. Slide the bag into a pillowcase or place a damp towel between your skin and the bag. SEEK MEDICAL CARE IF:  You develop white spots on your skin. This may give the skin a blotchy (mottled) appearance.  Your skin turns blue or pale.  Your skin becomes waxy or hard.  Your swelling gets worse. MAKE SURE YOU:   Understand these instructions.  Will watch your condition.  Will get help right away if you are not doing well or get worse. Document Released: 11/30/2010 Document Revised: 08/20/2013 Document Reviewed: 11/30/2010 [facility] Patient Information 2015 [place], [place]. This information is not intended to replace advice given to you by your health care provider. Make sure you discuss any questions you have with your health care provider.  Garen Lah, PT Certified Exercise Expert for the Aging Adult  05/11/18 9:13 AM Phone: [phone] Fax:  [phone]

## 2018-05-11 NOTE — Therapy (Signed)
[facility] [address] [place], [place], [address] Phone: [phone]   Fax:  [phone]  Physical Therapy Treatment/Recertification   Patient Details  Name: Jo MillerHye Sook Brown MRN: [id number] Date of Birth: 1933-08-29 Referring Provider (PT): Duwayne HeckJason Rogers, MD   Encounter Date: 05/11/2018  PT End of Session - 05/11/18 0942    Visit Number  5    Number of Visits  18    Date for PT Re-Evaluation  06/23/18    Authorization Type  medicare/medicaid     PT Start Time  [phone]   PT checking on AROM withMD started late   PT Stop Time  0940   2 untis only   PT Time Calculation (min)  42 min    Activity Tolerance  Patient tolerated treatment well    Behavior During Therapy  [facility] for tasks assessed/performed       Past Medical History:  Diagnosis Date  . Hypertension     Past Surgical History:  Procedure Laterality Date  . EYE SURGERY      There were no vitals filed for this visit.  Subjective Assessment - 05/11/18 0858    Subjective  When I move my arm it hurts    Limitations  Lifting    Currently in Pain?  Yes    Pain Score  3     Pain Location  Arm    Pain Orientation  Right;Proximal    Pain Descriptors / Indicators  Sore;Heaviness    Pain Type  --   sub acute   Pain Onset  More than a month ago    Pain Frequency  Intermittent         [facility] PT Assessment - 05/11/18 0906      Assessment   Medical Diagnosis  Fx Rt humerous    Referring Provider (PT)  Duwayne HeckJason Rogers, MD    Onset Date/Surgical Date  03/18/18      AROM   Overall AROM   Deficits    Left Shoulder Flexion  60 Degrees    Left Shoulder ABduction  50 Degrees    Left Shoulder Internal Rotation  15 Degrees   45 degree abd pain   Left Shoulder External Rotation  0 Degrees   45 degree abd pain     PROM   Overall PROM   Deficits    Right Shoulder Flexion  130 Degrees   ERP   Right Shoulder ABduction  103 Degrees    Right Shoulder Internal Rotation  70  Degrees    Right Shoulder External Rotation  30 Degrees      Palpation   Palpation comment  tenderness over all periscapular mx.                   [facility] Adult PT Treatment/Exercise - 05/11/18 0001      Self-Care   Self-Care  Heat/Ice Application    Heat/Ice Application  cryotherapy education for home after active exericise      Elbow Exercises   Elbow Flexion  Strengthening;Right;10 reps    Elbow Flexion Limitations  x 2 pain free propped on table    Forearm Supination  Right;10 reps    Forearm Supination Limitations  propped on table    Forearm Pronation  Right;10 reps    Forearm Pronation Limitations  propped on table    Other elbow exercises  grip strength with red thera putty 2 minutes      Shoulder Exercises: Seated   Other  Seated Exercises  bil shoulders on table and flexion to pt tolerance x 10       Shoulder Exercises: Standing   Other Standing Exercises  pendulum with right shoulder 6 side to side and 6 circles      Modalities   Modalities  Cryotherapy      Cryotherapy   Number Minutes Cryotherapy  10 Minutes    Cryotherapy Location  Shoulder    Type of Cryotherapy  Ice pack      Manual Therapy   Manual Therapy  Soft tissue mobilization;Passive ROM;Joint mobilization    Joint Mobilization  gentle oscillations for relaxation    Soft tissue mobilization  STM to upper arm anterior, lateral, posterior , right pectoral, latissimus, teres major    Passive ROM  Flexion , abduction, ER , IR              PT Education - 05/11/18 0906    Education Details  Added AROM to HEP     Person(s) Educated  Patient;Caregiver(s)   interpreter   Methods  Explanation;Demonstration;Tactile cues;Verbal cues    Comprehension  Verbalized understanding;Returned demonstration       PT Short Term Goals - 05/11/18 1300      PT SHORT TERM GOAL #1   Title  She will be independnet with initial HEP    Baseline  PT now beginning AROM 05-11-18    Time  2    Period   Weeks    Status  Achieved      PT SHORT TERM GOAL #2   Title  Passive flexion to 105 degrees     Baseline  130 flexion    Time  2    Period  Weeks    Status  Achieved      PT SHORT TERM GOAL #3   Title  passive abduction  to 95 degrees     Baseline  103    Time  2    Period  Weeks    Status  Achieved        PT Long Term Goals - 05/11/18 1301      PT LONG TERM GOAL #1   Title  She will be independent with all HEP issued    Baseline  Began AROM today 05-11-18    Time  6    Period  Weeks    Status  On-going    Target Date  06/22/18      PT LONG TERM GOAL #2   Title  She will be able to do all self care with RT are as normal with mild pain    Baseline  Began AROM today    Time  6    Period  Weeks    Status  On-going    Target Date  06/22/18      PT LONG TERM GOAL #3   Title  She will return to  home tasks with cooking and cleaning with light to moderate tasks with 3/10 max pain    Baseline  unable to do home tasks 05-11-18    Time  6    Period  Weeks    Status  On-going    Target Date  06/22/18      PT LONG TERM GOAL #4   Title  She will demo active ROM  with 3/10 max pain  over hea d with lifting 3 pounds to cabinet    Baseline  unable to lift weight    Time  6  Period  Weeks    Status  On-going    Target Date  06/22/18      PT LONG TERM GOAL #5   Title  "Tolerate light resistance exercises in flexion and scaption with minimal pain    Time  6    Period  Weeks    Status  New    Target Date  06/22/18            Plan - 05/11/18 1240    Clinical Impression Statement  Pt now progressed to AROM with order from Dr Aundria Rudogers.  Pt had been tentative with pendulum exericses given at MD office. but she was able to do today.  HEP starting slow due to pt fear of movement.  Pt given therapeutic putty for hand and HEP to begin movement with support as pt tolerance. Pt AROM right shld flex 60 , abd 50 Pt has accomplished all STG's . Pt has not accomplished any LTG and  will benefit from continued  PT.   Pt will benefit form 2 x a week for 6 weeks with skilled PT to address impairments    Rehab Potential  Good    PT Frequency  2x / week    PT Duration  6 weeks    PT Treatment/Interventions  Passive range of motion;Taping;Manual techniques;Patient/family education;Therapeutic exercise;Therapeutic activities;Cryotherapy;Moist Heat    PT Next Visit Plan  Recertified with AROM RX going forward, began iniital AROM,  may try supine cane as able     PT Home Exercise Plan  Support arm at rest above heart if able ,   active elbow FLex and ext.   family STW to tolerance  beginning AROM    Consulted and Agree with Plan of Care  Family member/caregiver;Patient       Patient will benefit from skilled therapeutic intervention in order to improve the following deficits and impairments:  Pain, Impaired UE functional use, Decreased strength, Decreased range of motion, Increased muscle spasms  Visit Diagnosis: Right shoulder pain, unspecified chronicity  Stiffness of right shoulder, not elsewhere classified  Muscle weakness (generalized)     Problem List There are no active problems to display for this patient.  Garen LahLawrie Floy Riegler, PT Certified Exercise Expert for the Aging Adult  05/11/18 1:12 PM Phone: [phone] Fax: [phone]  [facility] [address] [place], [place], [address] Phone: [phone]   Fax:  [phone]  Name: Jo MillerHye Sook Brown MRN: [id number] Date of Birth: Feb 03, 1934

## 2018-05-12 ENCOUNTER — Encounter

## 2018-05-15 ENCOUNTER — Ambulatory Visit: Payer: Medicare Other | Admitting: Physical Therapy

## 2018-05-15 ENCOUNTER — Encounter: Payer: Self-pay | Admitting: Physical Therapy

## 2018-05-15 DIAGNOSIS — M25511 Pain in right shoulder: Secondary | ICD-10-CM | POA: Diagnosis not present

## 2018-05-15 DIAGNOSIS — M6281 Muscle weakness (generalized): Secondary | ICD-10-CM

## 2018-05-15 DIAGNOSIS — M25611 Stiffness of right shoulder, not elsewhere classified: Secondary | ICD-10-CM

## 2018-05-15 NOTE — Therapy (Signed)
[facility] [address] [place], [place], [address] Phone: [phone]   Fax:  [phone]  Physical Therapy Treatment  Patient Details  Name: Jo Brown MRN: [id number] Date of Birth: 1933-09-07 Referring Provider (PT): Duwayne Heck, MD   Encounter Date: 05/15/2018  PT End of Session - 05/15/18 1208    Visit Number  6    Number of Visits  18    Date for PT Re-Evaluation  06/23/18    Authorization Type  medicare/medicaid     PT Start Time  1101    PT Stop Time  1145    PT Time Calculation (min)  44 min    Activity Tolerance  Patient tolerated treatment well    Behavior During Therapy  [facility] for tasks assessed/performed       Past Medical History:  Diagnosis Date  . Hypertension     Past Surgical History:  Procedure Laterality Date  . EYE SURGERY      There were no vitals filed for this visit.  Subjective Assessment - 05/15/18 1129    Subjective  Pt reports being sore after last session, but able to endure the pain.     Currently in Pain?  No/denies    Pain Score  0-No pain                       [facility] Adult PT Treatment/Exercise - 05/15/18 0001      Self-Care   Self-Care  ADL's    ADL's  Educated pt to do any activities at home that did not cause pain or did not require heavy lifting. Also how to transition her arm safely through exercises.      Shoulder Exercises: Seated   Horizontal ABduction  AROM;Strengthening;Right;10 reps   On Table   External Rotation  AROM;Strengthening;Right;20 reps   On table ~90 deg abducted; 10x palm up/ 10x palm down   Internal Rotation  Strengthening;AROM;20 reps   On table ~90 deg abducted; 10x palm up/10x palm down   Other Seated Exercises  bil shoulders on table and flexion to pt tolerance x 10    5 sec holds     Shoulder Exercises: Standing   Other Standing Exercises  pendulum with right shoulder 10 side to side and 10 circles      Manual Therapy   Manual Therapy  Passive ROM    Joint Mobilization  gentle oscillations for relaxation    Passive ROM  Flexion , abduction, ER , IR                PT Short Term Goals - 05/11/18 1300      PT SHORT TERM GOAL #1   Title  She will be independnet with initial HEP    Baseline  PT now beginning AROM 05-11-18    Time  2    Period  Weeks    Status  Achieved      PT SHORT TERM GOAL #2   Title  Passive flexion to 105 degrees     Baseline  130 flexion    Time  2    Period  Weeks    Status  Achieved      PT SHORT TERM GOAL #3   Title  passive abduction  to 95 degrees     Baseline  103    Time  2    Period  Weeks    Status  Achieved  PT Long Term Goals - 05/11/18 1301      PT LONG TERM GOAL #1   Title  She will be independent with all HEP issued    Baseline  Began AROM today 05-11-18    Time  6    Period  Weeks    Status  On-going    Target Date  06/22/18      PT LONG TERM GOAL #2   Title  She will be able to do all self care with RT are as normal with mild pain    Baseline  Began AROM today    Time  6    Period  Weeks    Status  On-going    Target Date  06/22/18      PT LONG TERM GOAL #3   Title  She will return to  home tasks with cooking and cleaning with light to moderate tasks with 3/10 max pain    Baseline  unable to do home tasks 05-11-18    Time  6    Period  Weeks    Status  On-going    Target Date  06/22/18      PT LONG TERM GOAL #4   Title  She will demo active ROM  with 3/10 max pain  over hea d with lifting 3 pounds to cabinet    Baseline  unable to lift weight    Time  6    Period  Weeks    Status  On-going    Target Date  06/22/18      PT LONG TERM GOAL #5   Title  "Tolerate light resistance exercises in flexion and scaption with minimal pain    Time  6    Period  Weeks    Status  New    Target Date  06/22/18            Plan - 05/15/18 1210    Clinical Impression Statement  Pt demos good recall of HEP, but is still hesitant  about moving her arm. Therapist educated pt to perform basic activities at home that did not cause pain and as long as it did not go against her MDs orders. Pt tolerated AROM exercises on the table very well today. Pt reports that the putty exercises have reduced her swelling in the right hand, but still feels soreness and tenderness along her rt arm and up into her A/P shoulder. Pt is progressing towards goals.     PT Next Visit Plan  Recertified with AROM RX going forward, began iniital AROM,  may try supine cane as able    PT Home Exercise Plan  Support arm at rest above heart if able ,   active elbow FLex and ext.   family STW to tolerance  beginning AROM    Consulted and Agree with Plan of Care  Patient;Family member/caregiver    Family Member Consulted  sister in law       During this treatment session, the therapist was present, participating in and directing the treatment.  Patient will benefit from skilled therapeutic intervention in order to improve the following deficits and impairments:     Visit Diagnosis: Right shoulder pain, unspecified chronicity  Stiffness of right shoulder, not elsewhere classified  Muscle weakness (generalized)     Problem List There are no active problems to display for this patient.   Royetta AsalHeather Vaudine Dutan, SPTA 05/15/2018, 12:14 PM   Jannette SpannerJessica Donoho, PTA 05/15/18 12:20 PM Phone: [phone] Fax: [phone]  [facility] [address] [place], [place], [address] Phone: [phone]   Fax:  [phone]  Name: Jo MillerHye Sook Brown MRN: [id number] Date of Birth: 01/11/1934

## 2018-05-17 ENCOUNTER — Encounter: Payer: Self-pay | Admitting: Physical Therapy

## 2018-05-17 ENCOUNTER — Ambulatory Visit: Payer: Medicare Other | Admitting: Physical Therapy

## 2018-05-17 DIAGNOSIS — M25511 Pain in right shoulder: Secondary | ICD-10-CM | POA: Diagnosis not present

## 2018-05-17 DIAGNOSIS — M6281 Muscle weakness (generalized): Secondary | ICD-10-CM

## 2018-05-17 DIAGNOSIS — M25611 Stiffness of right shoulder, not elsewhere classified: Secondary | ICD-10-CM

## 2018-05-17 NOTE — Therapy (Signed)
[facility] [address] [place], [place], [address] Phone: [phone]   Fax:  [phone]  Physical Therapy Treatment  Patient Details  Name: Jo MillerHye Sook Brown MRN: [id number] Date of Birth: Jun 09, 1933 Referring Provider (PT): Duwayne HeckJason Rogers, MD   Encounter Date: 05/17/2018  PT End of Session - 05/17/18 1150    Visit Number  6    Number of Visits  18    Date for PT Re-Evaluation  06/23/18    Authorization Type  medicare/medicaid     PT Start Time  1106   Therapist runninng late   PT Stop Time  1146    PT Time Calculation (min)  40 min    Activity Tolerance  Patient tolerated treatment well    Behavior During Therapy  [facility] for tasks assessed/performed       Past Medical History:  Diagnosis Date  . Hypertension     Past Surgical History:  Procedure Laterality Date  . EYE SURGERY      There were no vitals filed for this visit.  Subjective Assessment - 05/17/18 1213    Subjective  Pt reports feeling a little sore after last session.     Currently in Pain?  No/denies                       [facility]/Ex[facility] - 05/17/18 0001      Elbow Exercises   Elbow Flexion  AROM;15 reps    Other elbow exercises  Wrist flexion/ext x20 with yellow grip cylinder   pt reported some difficulty with flexion d/t bruising      Shoulder Exercises: Supine   External Rotation  AAROM;Right;10 reps   With dowel   Flexion  AAROM;20 reps   10 reps with chest press; 10 reps >90 deg with dowel     Shoulder Exercises: Standing   Other Standing Exercises  pendulum with right shoulder 10 side to side and 10 circles   Cueing for foot placement and posture   Other Standing Exercises  Flexion with dowel x15; 3 sec hold   Cues for extended elbow and direction of arm     Manual Therapy   Manual Therapy  Passive ROM    Joint Mobilization  gentle oscillations for relaxation    Passive ROM  Flexion , ER , IR                 PT Short Term Goals - 05/11/18 1300      PT SHORT TERM GOAL #1   Title  She will be independnet with initial HEP    Baseline  PT now beginning AROM 05-11-18    Time  2    Period  Weeks    Status  Achieved      PT SHORT TERM GOAL #2   Title  Passive flexion to 105 degrees     Baseline  130 flexion    Time  2    Period  Weeks    Status  Achieved      PT SHORT TERM GOAL #3   Title  passive abduction  to 95 degrees     Baseline  103    Time  2    Period  Weeks    Status  Achieved        PT Long Term Goals - 05/11/18 1301      PT LONG TERM GOAL #1   Title  She will be independent  with all HEP issued    Baseline  Began AROM today 05-11-18    Time  6    Period  Weeks    Status  On-going    Target Date  06/22/18      PT LONG TERM GOAL #2   Title  She will be able to do all self care with RT are as normal with mild pain    Baseline  Began AROM today    Time  6    Period  Weeks    Status  On-going    Target Date  06/22/18      PT LONG TERM GOAL #3   Title  She will return to  home tasks with cooking and cleaning with light to moderate tasks with 3/10 max pain    Baseline  unable to do home tasks 05-11-18    Time  6    Period  Weeks    Status  On-going    Target Date  06/22/18      PT LONG TERM GOAL #4   Title  She will demo active ROM  with 3/10 max pain  over hea d with lifting 3 pounds to cabinet    Baseline  unable to lift weight    Time  6    Period  Weeks    Status  On-going    Target Date  06/22/18      PT LONG TERM GOAL #5   Title  "Tolerate light resistance exercises in flexion and scaption with minimal pain    Time  6    Period  Weeks    Status  New    Target Date  06/22/18            Plan - 05/17/18 1150    Clinical Impression Statement  Pt reported having pain in back while performing pendulums at home, so therapist provided proper foot adjustments and height of her bend to relieve pain. Pt is progressing towards more  active and active assistive exercises, but has pain with most of them. Performed some elbow and wrist strengthening exercises d/t pt still having pain with active shoulder exercises. Instructed to do standing dowel exercises into flexion and ER at home and will provide handout next visit.     PT Next Visit Plan Repeat standing dowel exercises and provide handout. Recertified with AROM RX going forward, began iniital AROM,  provide handout for dowel exercises    PT Home Exercise Plan  Support arm at rest above heart if able ,   active elbow FLex and ext.   family STW to tolerance  beginning AROM, flexion/ER with dowel    Consulted and Agree with Plan of Care  Patient;Family member/caregiver    Family Member Consulted  sister in law      During this treatment session, the therapist was present, participating in and directing the treatment.  Patient will benefit from skilled therapeutic intervention in order to improve the following deficits and impairments:     Visit Diagnosis: Right shoulder pain, unspecified chronicity  Stiffness of right shoulder, not elsewhere classified  Muscle weakness (generalized)     Problem List There are no active problems to display for this patient.   Royetta AsalHeather Lillian Tigges, SPTA 05/17/2018, 12:14 PM   Jannette SpannerJessica Donoho, PTA 05/17/18 12:52 PM Phone: [phone] Fax: [phone]  [facility] [address] [place], [place], [address] Phone: [phone]   Fax:  [phone]  Name: Jo MillerHye Sook Brown MRN:  [id number] Date of Birth: 08-26-1933

## 2018-05-22 ENCOUNTER — Encounter: Payer: Self-pay | Admitting: Physical Therapy

## 2018-05-22 ENCOUNTER — Ambulatory Visit: Payer: Medicare Other | Attending: Orthopedic Surgery | Admitting: Physical Therapy

## 2018-05-22 DIAGNOSIS — M25511 Pain in right shoulder: Secondary | ICD-10-CM | POA: Diagnosis present

## 2018-05-22 DIAGNOSIS — M25611 Stiffness of right shoulder, not elsewhere classified: Secondary | ICD-10-CM

## 2018-05-22 DIAGNOSIS — M6281 Muscle weakness (generalized): Secondary | ICD-10-CM | POA: Insufficient documentation

## 2018-05-22 NOTE — Therapy (Signed)
[facility] [address] [place], [place], [address] Phone: [phone]   Fax:  [phone]  Physical Therapy Treatment  Patient Details  Name: Jo MillerHye Sook Olthoff MRN: [id number] Date of Birth: [birth year] Referring Provider (PT): Duwayne HeckJason Rogers, MD   Encounter Date: 05/22/2018  PT End of Session - 05/22/18 1248    Visit Number  7    Number of Visits  18    Date for PT Re-Evaluation  06/23/18    Authorization Type  medicare/medicaid     PT Start Time  1017    PT Stop Time  1103    PT Time Calculation (min)  46 min    Activity Tolerance  Patient tolerated treatment well    Behavior During Therapy  [facility] for tasks assessed/performed       Past Medical History:  Diagnosis Date  . Hypertension     Past Surgical History:  Procedure Laterality Date  . EYE SURGERY      There were no vitals filed for this visit.  Subjective Assessment - 05/22/18 1021    Subjective  Pt denies pain, but is having more trouble with flexion AAROM than abduction.     Currently in Pain?  No/denies                       [facility] 05/22/18 0001      Elbow Exercises   Elbow Flexion  Strengthening;Right;20 reps   10x 2 sets thumb up; 10 reps palm up   Bar Weights/Barbell (Elbow Flexion)  1 lb      Shoulder Exercises: Seated   External Rotation  AROM;Right;15 reps   Pt could not tolerate weight yet     Shoulder Exercises: Standing   Other Standing Exercises  pendulum with right shoulder 5 side to side and 5 circles; Wall slides x10 AAROM with other hand, 10x active    Cueing for foot placement and posture   Other Standing Exercises  Flexion with dowel x15; 3 sec hold; Velcro board with key and doorknob tools; 6 minutes      Shoulder Exercises: Pulleys   Flexion  2 minutes    Scaption  2 minutes      Shoulder Exercises: ROM/Strengthening   Other ROM/Strengthening Exercises  Dowel exercises in ER and flexion        Shoulder Exercises: Stretch   Internal Rotation Stretch  5 reps   5 reps 10 sec holds with pillow case   External Rotation Stretch  5 reps;20 seconds             PT Education - 05/22/18 1256    Education Details  HEP    Person(s) Educated  Patient;Caregiver(s)    Methods  Explanation;Demonstration;Tactile cues;Verbal cues;Handout    Comprehension  Verbalized understanding;Returned demonstration       PT Short Term Goals - 05/11/18 1300      PT SHORT TERM GOAL #1   Title  She will be independnet with initial HEP    Baseline  PT now beginning AROM 05-11-18    Time  2    Period  Weeks    Status  Achieved      PT SHORT TERM GOAL #2   Title  Passive flexion to 105 degrees     Baseline  130 flexion    Time  2    Period  Weeks    Status  Achieved      PT  SHORT TERM GOAL #3   Title  passive abduction  to 95 degrees     Baseline  103    Time  2    Period  Weeks    Status  Achieved        PT Long Term Goals - 05/11/18 1301      PT LONG TERM GOAL #1   Title  She will be independent with all HEP issued    Baseline  Began AROM today 05-11-18    Time  6    Period  Weeks    Status  On-going    Target Date  06/22/18      PT LONG TERM GOAL #2   Title  She will be able to do all self care with RT are as normal with mild pain    Baseline  Began AROM today    Time  6    Period  Weeks    Status  On-going    Target Date  06/22/18      PT LONG TERM GOAL #3   Title  She will return to  home tasks with cooking and cleaning with light to moderate tasks with 3/10 max pain    Baseline  unable to do home tasks 05-11-18    Time  6    Period  Weeks    Status  On-going    Target Date  06/22/18      PT LONG TERM GOAL #4   Title  She will demo active ROM  with 3/10 max pain  over hea d with lifting 3 pounds to cabinet    Baseline  unable to lift weight    Time  6    Period  Weeks    Status  On-going    Target Date  06/22/18      PT LONG TERM GOAL #5   Title   "Tolerate light resistance exercises in flexion and scaption with minimal pain    Time  6    Period  Weeks    Status  New    Target Date  06/22/18            Plan - 05/22/18 1249    Clinical Impression Statement  Pt reported still having trouble reaching overhead, specifically with the dowel exercises into flexion. But denies pain today and tolerated tx well. Pt still needs some active assist during shoulder flexion, but is slowly progressing to being active. Pt required cues for shoulder alignment throughout exercises and denies pain after tx. Updated HEP.     PT Next Visit Plan  Recertified with AROM RX going forward, began iniital AROM,  provide handout of standing dowel ER/flexion, cont strengthening and increase ER ROM    PT Home Exercise Plan  Support arm at rest above heart if able ,   active elbow FLex and ext.   family STW to tolerance  beginning AROM, flexion/ER with dowel, IR stretch with pillowcase    Consulted and Agree with Plan of Care  Patient;Family member/caregiver    Family Member Consulted  sister in law      [name], [name] 05/22/18 12:59 PM Phone: [phone] Fax: [phone]  Patient will benefit from skilled therapeutic intervention in order to improve the following deficits and impairments:     Visit Diagnosis: Stiffness of right shoulder, not elsewhere classified  Muscle weakness (generalized)  Right shoulder pain, unspecified chronicity     Problem List There are no active problems to display for  this patient.   Royetta Asal, SPTA 05/22/2018, 12:57 PM   [name], PTA 05/22/18 12:59 PM Phone: [phone] Fax: [phone]  [facility] [address] [place], [place], [address] Phone: [phone]   Fax:  [phone]  Name: Jo Brown MRN: [id number] Date of Birth: [birth year]

## 2018-05-24 ENCOUNTER — Ambulatory Visit: Payer: Medicare Other

## 2018-05-24 DIAGNOSIS — M25611 Stiffness of right shoulder, not elsewhere classified: Secondary | ICD-10-CM | POA: Diagnosis not present

## 2018-05-24 DIAGNOSIS — M25511 Pain in right shoulder: Secondary | ICD-10-CM

## 2018-05-24 DIAGNOSIS — M6281 Muscle weakness (generalized): Secondary | ICD-10-CM

## 2018-05-24 NOTE — Therapy (Signed)
[facility] [address] [place], [place], [address] Phone: [phone]   Fax:  [phone]  Physical Therapy Treatment  Patient Details  Name: Jo Brown MRN: [id number] Date of Birth: 10-01-1933 Referring Provider (PT): Duwayne Heck, MD   Encounter Date: 05/24/2018  PT End of Session - 05/24/18 1015    Visit Number  8    Number of Visits  18    Date for PT Re-Evaluation  06/23/18    Authorization Type  medicare/medicaid     PT Start Time  1015    PT Stop Time  1100    PT Time Calculation (min)  45 min    Activity Tolerance  Patient tolerated treatment well    Behavior During Therapy  [facility] for tasks assessed/performed       Past Medical History:  Diagnosis Date  . Hypertension     Past Surgical History:  Procedure Laterality Date  . EYE SURGERY      There were no vitals filed for this visit.  Subjective Assessment - 05/24/18 1018    Subjective  PAin with moving 3/10.  No pain at rest.   doing HEP 3x/day    Patient is accompained by:  Family member;Interpreter    Currently in Pain?  No/denies         [facility] PT Assessment - 05/24/18 0001      AROM   AROM Assessment Site  Shoulder    Right/Left Shoulder  Right    Right Shoulder Extension  38 Degrees    Right Shoulder Flexion  68 Degrees   at end of session 85 degrees   Right Shoulder ABduction  65 Degrees   end of session 85 degrees     Right Shoulder Internal Rotation  68 Degrees   humerous at side   Right Shoulder External Rotation  45 Degrees   humerous at side                  [facility] Adult PT Treatment/Exercise - 05/24/18 0001      Shoulder Exercises: Supine   Other Supine Exercises  assited flexion , rotation , x 15-20 reps       Shoulder Exercises: Sidelying   External Rotation  AAROM;Right;15 reps    ABduction  AAROM;Right;15 reps    Other Sidelying Exercises  also hor abd and add short ROM for stabilization  x 15 reps and reach to  ceiling x 15 assisted      Manual Therapy   Joint Mobilization  gentle oscillations for relaxation, inferior and distraction x 50 reps     Passive ROM  all planes               PT Short Term Goals - 05/11/18 1300      PT SHORT TERM GOAL #1   Title  She will be independnet with initial HEP    Baseline  PT now beginning AROM 05-11-18    Time  2    Period  Weeks    Status  Achieved      PT SHORT TERM GOAL #2   Title  Passive flexion to 105 degrees     Baseline  130 flexion    Time  2    Period  Weeks    Status  Achieved      PT SHORT TERM GOAL #3   Title  passive abduction  to 95 degrees     Baseline  103  Time  2    Period  Weeks    Status  Achieved        PT Long Term Goals - 05/11/18 1301      PT LONG TERM GOAL #1   Title  She will be independent with all HEP issued    Baseline  Began AROM today 05-11-18    Time  6    Period  Weeks    Status  On-going    Target Date  06/22/18      PT LONG TERM GOAL #2   Title  She will be able to do all self care with RT are as normal with mild pain    Baseline  Began AROM today    Time  6    Period  Weeks    Status  On-going    Target Date  06/22/18      PT LONG TERM GOAL #3   Title  She will return to  home tasks with cooking and cleaning with light to moderate tasks with 3/10 max pain    Baseline  unable to do home tasks 05-11-18    Time  6    Period  Weeks    Status  On-going    Target Date  06/22/18      PT LONG TERM GOAL #4   Title  She will demo active ROM  with 3/10 max pain  over hea d with lifting 3 pounds to cabinet    Baseline  unable to lift weight    Time  6    Period  Weeks    Status  On-going    Target Date  06/22/18      PT LONG TERM GOAL #5   Title  "Tolerate light resistance exercises in flexion and scaption with minimal pain    Time  6    Period  Weeks    Status  New    Target Date  06/22/18            Plan - 05/24/18 1057    Clinical Impression Statement  Still limtied but  improved active flex and abduction by end of session. Process will be slow it appears to not incr pain .   She does haves some popping wih active movement. This is mild    PT Treatment/Interventions  Passive range of motion;Taping;Manual techniques;Patient/family education;Therapeutic exercise;Therapeutic activities;Cryotherapy;Moist Heat    PT Next Visit Plan  Continue manual and AROm as able , modalities if needed may start wall slides see what MD says    PT Home Exercise Plan  Support arm at rest above heart if able ,   active elbow FLex and ext.   family STW to tolerance  beginning AROM, flexion/ER with dowel, IR stretch with pillowcase    Consulted and Agree with Plan of Care  Patient       Patient will benefit from skilled therapeutic intervention in order to improve the following deficits and impairments:  Pain, Impaired UE functional use, Decreased strength, Decreased range of motion, Increased muscle spasms  Visit Diagnosis: Stiffness of right shoulder, not elsewhere classified  Muscle weakness (generalized)  Right shoulder pain, unspecified chronicity     Problem List There are no active problems to display for this patient.   Caprice RedChasse, Nikitas Davtyan M  PT 05/24/2018, 11:00 AM  [facility] [address] [place], [place], [address] Phone: [phone]   Fax:  [phone]  Name: Percell MillerHye Sook Arismendez MRN: [id number]  Date of Birth: 04-Jul-1933

## 2018-05-29 ENCOUNTER — Encounter: Payer: Self-pay | Admitting: Physical Therapy

## 2018-05-29 ENCOUNTER — Ambulatory Visit: Payer: Medicare Other | Admitting: Physical Therapy

## 2018-05-29 DIAGNOSIS — M25611 Stiffness of right shoulder, not elsewhere classified: Secondary | ICD-10-CM

## 2018-05-29 DIAGNOSIS — M6281 Muscle weakness (generalized): Secondary | ICD-10-CM

## 2018-05-29 DIAGNOSIS — M25511 Pain in right shoulder: Secondary | ICD-10-CM

## 2018-05-29 NOTE — Therapy (Signed)
[facility] [address] [place], [place], [address] Phone: [phone]   Fax:  [phone]  Physical Therapy Treatment  Patient Details  Name: Jo Brown MRN: [id number] Date of Birth: [birth year] Referring Provider (PT): Duwayne Heck, MD   Encounter Date: 05/29/2018  PT End of Session - 05/29/18 1124    Visit Number  9    Number of Visits  18    Date for PT Re-Evaluation  06/23/18    Authorization Type  medicare/medicaid     PT Start Time  1015    PT Stop Time  1108    PT Time Calculation (min)  53 min    Activity Tolerance  Patient tolerated treatment well    Behavior During Therapy  [facility] for tasks assessed/performed       Past Medical History:  Diagnosis Date  . Hypertension     Past Surgical History:  Procedure Laterality Date  . EYE SURGERY      There were no vitals filed for this visit.  Subjective Assessment - 05/29/18 1021    Subjective  Pt reports still having trouble with OH reaching, but is doing her exercises every day. Pt still reports pain when moving arm down from flexion.     Currently in Pain?  Yes    Pain Score  3     Pain Location  Arm    Pain Orientation  Right;Proximal    Pain Descriptors / Indicators  Sore    Aggravating Factors   when lowering arm    Pain Relieving Factors  rest                       [facility] Adult PT Treatment/Exercise - 05/29/18 0001      Shoulder Exercises: Supine   External Rotation  AAROM;10 reps;Limitations   3 sec hold   External Rotation Limitations  With dowel    ABduction  AAROM;12 reps;Limitations    ABduction Limitations  With dowel    Other Supine Exercises  assited flexion  x 15-20 reps       Shoulder Exercises: Standing   Other Standing Exercises  Wall slides in flexion x15 with 3 sec hold; scaption x5      Shoulder Exercises: Pulleys   Flexion  2 minutes    Scaption  2 minutes      Shoulder Exercises: Stretch   External Rotation  Stretch  10 seconds   10 reps; with dowel     Cryotherapy   Number Minutes Cryotherapy  10 Minutes    Cryotherapy Location  Upper arm    Type of Cryotherapy  Ice pack      Manual Therapy   Manual Therapy  Passive ROM    Passive ROM  all planes               PT Short Term Goals - 05/11/18 1300      PT SHORT TERM GOAL #1   Title  She will be independnet with initial HEP    Baseline  PT now beginning AROM 05-11-18    Time  2    Period  Weeks    Status  Achieved      PT SHORT TERM GOAL #2   Title  Passive flexion to 105 degrees     Baseline  130 flexion    Time  2    Period  Weeks    Status  Achieved  PT SHORT TERM GOAL #3   Title  passive abduction  to 95 degrees     Baseline  103    Time  2    Period  Weeks    Status  Achieved        PT Long Term Goals - 05/11/18 1301      PT LONG TERM GOAL #1   Title  She will be independent with all HEP issued    Baseline  Began AROM today 05-11-18    Time  6    Period  Weeks    Status  On-going    Target Date  06/22/18      PT LONG TERM GOAL #2   Title  She will be able to do all self care with RT are as normal with mild pain    Baseline  Began AROM today    Time  6    Period  Weeks    Status  On-going    Target Date  06/22/18      PT LONG TERM GOAL #3   Title  She will return to  home tasks with cooking and cleaning with light to moderate tasks with 3/10 max pain    Baseline  unable to do home tasks 05-11-18    Time  6    Period  Weeks    Status  On-going    Target Date  06/22/18      PT LONG TERM GOAL #4   Title  She will demo active ROM  with 3/10 max pain  over hea d with lifting 3 pounds to cabinet    Baseline  unable to lift weight    Time  6    Period  Weeks    Status  On-going    Target Date  06/22/18      PT LONG TERM GOAL #5   Title  "Tolerate light resistance exercises in flexion and scaption with minimal pain    Time  6    Period  Weeks    Status  New    Target Date  06/22/18             Plan - 05/29/18 1128    Clinical Impression Statement  Pt still demos a lot of pain and some limitation with flexion and abduction. Pt was able to get to 90 degrees abduction passively, but was limited by pain. Pt demos more eccentric control with wall slides and less pain. Pt also reported IR stretch was easier using other hand rather than towel. Provided pt with ice pack at end of session to relieve pain in proximal shoulder.     PT Next Visit Plan  Continue manual and AROm as able , modalities if needed; cont wall slides     PT Home Exercise Plan  Support arm at rest above heart if able ,   active elbow FLex and ext.   family STW to tolerance  beginning AROM, flexion/ER with dowel, IR stretch with pillowcase    Consulted and Agree with Plan of Care  Patient    Family Member Consulted  sister in law      During this treatment session, the therapist was present, participating in and directing the treatment.  Patient will benefit from skilled therapeutic intervention in order to improve the following deficits and impairments:  Pain, Impaired UE functional use, Decreased strength, Decreased range of motion, Increased muscle spasms  Visit Diagnosis: Stiffness of right shoulder, not elsewhere classified  Muscle weakness (generalized)  Right shoulder pain, unspecified chronicity     Problem List There are no active problems to display for this patient.   Jo Brown, Jo Brown , [place] 05/29/2018, 11:53 AM   Jo SpannerJessica Donoho, PTA 05/29/18 11:53 AM Phone: [phone] Fax: [phone]  [facility] [address] [place], [place], [address] Phone: [phone]   Fax:  [phone]  Name: Jo MillerHye Sook Brown MRN: [id number] Date of Birth: 05/21/1933

## 2018-05-31 ENCOUNTER — Encounter: Payer: Self-pay | Admitting: Physical Therapy

## 2018-05-31 ENCOUNTER — Ambulatory Visit: Payer: Medicare Other | Admitting: Physical Therapy

## 2018-05-31 DIAGNOSIS — M25611 Stiffness of right shoulder, not elsewhere classified: Secondary | ICD-10-CM

## 2018-05-31 DIAGNOSIS — M25511 Pain in right shoulder: Secondary | ICD-10-CM

## 2018-05-31 DIAGNOSIS — M6281 Muscle weakness (generalized): Secondary | ICD-10-CM

## 2018-05-31 NOTE — Therapy (Addendum)
[facility] [address] [place], [place], [address] Phone: [phone]   Fax:  [phone]  Physical Therapy Treatment  Patient Details  Name: Jo Brown MRN: [id number] Date of Birth: Aug 24, 1933 Referring Provider (PT): Duwayne Heck, MD   Progress Note Reporting Period 04/20/2018 to 05/31/2018  See note below for Objective Data and Assessment of Progress/Goals.       Encounter Date: 05/31/2018  PT End of Session - 05/31/18 1016    Visit Number  10    Number of Visits  18    Date for PT Re-Evaluation  06/23/18    Authorization Type  medicare/medicaid     PT Start Time  1015    PT Stop Time  1109    PT Time Calculation (min)  54 min       Past Medical History:  Diagnosis Date  . Hypertension     Past Surgical History:  Procedure Laterality Date  . EYE SURGERY      There were no vitals filed for this visit.  Subjective Assessment - 05/31/18 1015    Subjective  Pt saw MD. Per MD note, continue PROM, AAROM and AROM. Will F/U in 2 months.     Patient is accompained by:  Family member;Interpreter    Currently in Pain?  --   intermittent mild/moderate pain with eccentrics and end range ROM during session                       [facility] Adult PT Treatment/Exercise - 05/31/18 0001      Shoulder Exercises: Supine   Other Supine Exercises  supine cane pressups, pullovers, horizontals x 10 each       Shoulder Exercises: Standing   Row  10 reps   2 sets, one at doorway for HEP   Theraband Level (Shoulder Row)  Level 1 (Yellow)    Other Standing Exercises  Standing cane flexion, scaption, extension, IR behind, ER in front  x 10 each     Other Standing Exercises  Wall ladder x 10 walking up and down. , rolling large physioball on table flexion and ER/IR bilateral and right only       Shoulder Exercises: Pulleys   Flexion  2 minutes    Scaption  2 minutes      Shoulder Exercises: ROM/Strengthening   UBE  (Upper Arm Bike)  Old UBE used for beginning AAROM x 2 min each way       Cryotherapy   Number Minutes Cryotherapy  10 Minutes    Cryotherapy Location  Upper arm    Type of Cryotherapy  Ice pack      Manual Therapy   Manual Therapy  Passive ROM    Passive ROM  all planes             PT Education - 05/31/18 1106    Education Details  HEP via interpreter    Person(s) Educated  Patient;Other (comment)   Sister-in-law   Methods  Explanation;Handout    Comprehension  Verbalized understanding       PT Short Term Goals - 05/11/18 1300      PT SHORT TERM GOAL #1   Title  She will be independnet with initial HEP    Baseline  PT now beginning AROM 05-11-18    Time  2    Period  Weeks    Status  Achieved      PT SHORT TERM GOAL #2  Title  Passive flexion to 105 degrees     Baseline  130 flexion    Time  2    Period  Weeks    Status  Achieved      PT SHORT TERM GOAL #3   Title  passive abduction  to 95 degrees     Baseline  103    Time  2    Period  Weeks    Status  Achieved        PT Long Term Goals - 05/11/18 1301      PT LONG TERM GOAL #1   Title  She will be independent with all HEP issued    Baseline  Began AROM today 05-11-18    Time  6    Period  Weeks    Status  On-going    Target Date  06/22/18      PT LONG TERM GOAL #2   Title  She will be able to do all self care with RT are as normal with mild pain    Baseline  Began AROM today    Time  6    Period  Weeks    Status  On-going    Target Date  06/22/18      PT LONG TERM GOAL #3   Title  She will return to  home tasks with cooking and cleaning with light to moderate tasks with 3/10 max pain    Baseline  unable to do home tasks 05-11-18    Time  6    Period  Weeks    Status  On-going    Target Date  06/22/18      PT LONG TERM GOAL #4   Title  She will demo active ROM  with 3/10 max pain  over hea d with lifting 3 pounds to cabinet    Baseline  unable to lift weight    Time  6    Period   Weeks    Status  On-going    Target Date  06/22/18      PT LONG TERM GOAL #5   Title  "Tolerate light resistance exercises in flexion and scaption with minimal pain    Time  6    Period  Weeks    Status  New    Target Date  06/22/18            Plan - 05/31/18 1111    Clinical Impression Statement  Pt saw MD who recommended 2 more months of PT before next follow up. Per MD progress note:continue PROM, AROM, AAROM to recover full ROM. Advanced to standing cane exercises which she did very well. Began unresisted UBE for AAROM, she also did well with this. Updated HEP with yellow band rows.     PT Next Visit Plan  add standing cane to HEP if she feels okay next session, continue manual, PROM, AAROM, AROM as tolerated     PT Home Exercise Plan  Support arm at rest above heart if able ,   active elbow FLex and ext.   family STW to tolerance  beginning AROM, flexion/ER with dowel, IR stretch with pillowcase, yellow band rows     Consulted and Agree with Plan of Care  Patient    Family Member Consulted  sister in law     During this treatment session, the therapist was present, participating in and directing the treatment.  Patient will benefit from skilled therapeutic intervention in order to improve the  following deficits and impairments:  Pain, Impaired UE functional use, Decreased strength, Decreased range of motion, Increased muscle spasms  Visit Diagnosis: Muscle weakness (generalized)  Right shoulder pain, unspecified chronicity  Stiffness of right shoulder, not elsewhere classified     Problem List There are no active problems to display for this patient.   [name], [name] 05/31/2018, 11:14 AM  [facility] [address] [place], [place], [address] Phone: [phone]   Fax:  [phone]  Name: Jo Brown MRN: [id number] Date of Birth: 04-19-1934

## 2018-06-05 ENCOUNTER — Encounter: Payer: Self-pay | Admitting: Physical Therapy

## 2018-06-05 ENCOUNTER — Ambulatory Visit: Payer: Medicare Other | Admitting: Physical Therapy

## 2018-06-05 DIAGNOSIS — M6281 Muscle weakness (generalized): Secondary | ICD-10-CM

## 2018-06-05 DIAGNOSIS — M25611 Stiffness of right shoulder, not elsewhere classified: Secondary | ICD-10-CM | POA: Diagnosis not present

## 2018-06-05 DIAGNOSIS — M25511 Pain in right shoulder: Secondary | ICD-10-CM

## 2018-06-05 NOTE — Patient Instructions (Signed)
Flexion (Eccentric) - Active-Assist (Cane)   Use unaffected arm to push affected arm forward. Avoid hiking shoulder. Keep palm relaxed. Slowly lower affected arm for 3-5 seconds, increasing use of affected arm. Repeat 10 times, 2 sessions per day.   Copyright  VHI. All rights reserved  Cane Exercise: Abduction   Hold cane with right hand over end, palm-up, with other hand palm-down. Move arm out from side and up by pushing with other arm. Hold _5___ seconds. Repeat _10___ times. Do __2__ sessions per day.  http://[email or web address].[email or web address]   Copyright  VHI. All rights reserved.      Cane Exercise: Extension / Internal Rotation   Stand holding cane behind back with both hands palm-up. Slide cane up spine toward head. Hold _5___ seconds. Repeat _10___ times. Do __2__ sessions per day.  http://[email or web address].[email or web address]   Copyright  VHI. All rights reserved.  Gilmer Mor Exercise: Extension   Stand holding cane behind back with both hands palm-up. Lift the cane away from body. Hold __5__ seconds. Repeat __10__ times. Do __2__ sessions per day.  http://[email or web address].[email or web address]   Copyright  VHI. All rights reserved.

## 2018-06-05 NOTE — Therapy (Addendum)
[facility] [address] [place], [place], [address] Phone: [phone]   Fax:  [phone]  Physical Therapy Treatment  Patient Details  Name: Jo Brown MRN: [id number] Date of Birth: Jun 08, 1933 Referring Provider (PT): Duwayne Heck, MD   See note below for Objective Data and Assessment of Progress/Goals.     Encounter Date: 06/05/2018  PT End of Session - 06/05/18 1015    Visit Number  11    Number of Visits  18    Date for PT Re-Evaluation  06/23/18    Authorization Type  medicare/medicaid     PT Start Time  1015    PT Stop Time  1110    PT Time Calculation (min)  55 min       Past Medical History:  Diagnosis Date  . Hypertension     Past Surgical History:  Procedure Laterality Date  . EYE SURGERY      There were no vitals filed for this visit.      [facility] PT Assessment - 06/05/18 0001      AROM   Right Shoulder Flexion  87 Degrees    Right Shoulder ABduction  72 Degrees    Right Shoulder Internal Rotation  --   reach to right buttock   Right Shoulder External Rotation  --   reach to right temporal area                  [facility] Adult PT Treatment/Exercise - 06/05/18 0001      Shoulder Exercises: Standing   Row  10 reps   2 sets, one at doorway for HEP   Theraband Level (Shoulder Row)  Level 1 (Yellow)    Other Standing Exercises  Standing cane flexion, scaption, extension, IR behind, ER in front  x 10 each     Other Standing Exercises  Wall ladder /walkingx 10 walking up and down      Shoulder Exercises: Pulleys   Flexion  2 minutes      Shoulder Exercises: ROM/Strengthening   UBE (Upper Arm Bike)  Old UBE used for beginning AAROM x 2 min each way     Other ROM/Strengthening Exercises  standing cabinet reach with cone to bottom shelf. x 10, #1 x 10      Cryotherapy   Number Minutes Cryotherapy  10 Minutes    Cryotherapy Location  Upper arm    Type of Cryotherapy  Ice pack       Manual Therapy   Manual Therapy  Passive ROM    Passive ROM  all planes             PT Education - 06/05/18 1046    Education Details  Hep    Person(s) Educated  Patient    Methods  Explanation;Handout    Comprehension  Verbalized understanding       PT Short Term Goals - 05/11/18 1300      PT SHORT TERM GOAL #1   Title  She will be independnet with initial HEP    Baseline  PT now beginning AROM 05-11-18    Time  2    Period  Weeks    Status  Achieved      PT SHORT TERM GOAL #2   Title  Passive flexion to 105 degrees     Baseline  130 flexion    Time  2    Period  Weeks    Status  Achieved  PT SHORT TERM GOAL #3   Title  passive abduction  to 95 degrees     Baseline  103    Time  2    Period  Weeks    Status  Achieved        PT Long Term Goals - 05/11/18 1301      PT LONG TERM GOAL #1   Title  She will be independent with all HEP issued    Baseline  Began AROM today 05-11-18    Time  6    Period  Weeks    Status  On-going    Target Date  06/22/18      PT LONG TERM GOAL #2   Title  She will be able to do all self care with RT are as normal with mild pain    Baseline  Began AROM today    Time  6    Period  Weeks    Status  On-going    Target Date  06/22/18      PT LONG TERM GOAL #3   Title  She will return to  home tasks with cooking and cleaning with light to moderate tasks with 3/10 max pain    Baseline  unable to do home tasks 05-11-18    Time  6    Period  Weeks    Status  On-going    Target Date  06/22/18      PT LONG TERM GOAL #4   Title  She will demo active ROM  with 3/10 max pain  over hea d with lifting 3 pounds to cabinet    Baseline  unable to lift weight    Time  6    Period  Weeks    Status  On-going    Target Date  06/22/18      PT LONG TERM GOAL #5   Title  "Tolerate light resistance exercises in flexion and scaption with minimal pain    Time  6    Period  Weeks    Status  New    Target Date  06/22/18             Plan - 06/05/18 1019    Clinical Impression Statement  Pt reports more pain with HEP because she only takes meds when coming to PT. Recommended she follow Rx instructions or consult MD. She reports improvement in ability to use right arm with feeding and washing dishes. She still has difficulty with dressing and grooming using right UE. Encourged her to use her arm as able to activities under shoulder height. Continued with AAROM as tolerated.     PT Next Visit Plan  continue manual, PROM, AAROM, AROM as tolerated     PT Home Exercise Plan  Support arm at rest above heart if able ,   active elbow FLex and ext.   family STW to tolerance  beginning AROM, flexion/ER with dowel, IR stretch with pillowcase, yellow band rows , standing cane exercises    Consulted and Agree with Plan of Care  Patient    Family Member Consulted  sister in law       Patient will benefit from skilled therapeutic intervention in order to improve the following deficits and impairments:  Pain, Impaired UE functional use, Decreased strength, Decreased range of motion, Increased muscle spasms  Visit Diagnosis: Muscle weakness (generalized)  Right shoulder pain, unspecified chronicity  Stiffness of right shoulder, not elsewhere classified     Problem List  There are no active problems to display for this patient.   [name], Abir Eroh McGee, [place]PT[place] 06/05/2018, 11:34 AM  [facility] [address] [place], [place], [address] Phone: [phone]   Fax:  [phone]  Name: Jo MillerHye Sook Brown MRN: [id number] Date of Birth: 03-Oct-1933

## 2018-06-08 ENCOUNTER — Ambulatory Visit: Payer: Medicare Other

## 2018-06-08 DIAGNOSIS — M25611 Stiffness of right shoulder, not elsewhere classified: Secondary | ICD-10-CM

## 2018-06-08 DIAGNOSIS — M25511 Pain in right shoulder: Secondary | ICD-10-CM

## 2018-06-08 DIAGNOSIS — M6281 Muscle weakness (generalized): Secondary | ICD-10-CM

## 2018-06-08 NOTE — Therapy (Signed)
[place], [place], [address] Phone: [phone]   Fax:  [phone]  Physical Therapy Treatment  Patient Details  Name: Jo Brown MRN: [id number] Date of Birth: 22-May-1933 Referring Provider (PT): Victorino December, MD   Encounter Date: 06/08/2018  PT End of Session - 06/08/18 1139    Visit Number  12    Number of Visits  22    Date for PT Re-Evaluation  07/28/18    Authorization Type  medicare/medicaid     PT Start Time  1105    PT Stop Time  1155    PT Time Calculation (min)  50 min    Activity Tolerance  Patient tolerated treatment well    Behavior During Therapy  [facility] for tasks assessed/performed       Past Medical History:  Diagnosis Date  . Hypertension     Past Surgical History:  Procedure Laterality Date  . EYE SURGERY      There were no vitals filed for this visit.  Subjective Assessment - 06/08/18 1108    Subjective  She reports  RT arm has most pain with raising RT arm.  She has begun to use arm for cleaning.  She admits RT arm is better    Patient is accompained by:  Family member;Interpreter    Patient Stated Goals  Use RT arm for normal activity    Currently in Pain?  No/denies   at rest.    Pain Score  3     Pain Location  Arm    Pain Orientation  Right    Pain Type  --   sub acutre   Pain Onset  More than a month ago    Pain Frequency  Intermittent    Aggravating Factors   when reaising arm    Pain Relieving Factors  rest         [facility] PT Assessment - 06/08/18 0001      Assessment   Medical Diagnosis  Fx Rt humerous    Referring Provider (PT)  Victorino December, MD      AROM   Right Shoulder Flexion  83 Degrees    Right Shoulder ABduction  80 Degrees    Right Shoulder Internal Rotation  60 Degrees   in supine   Right Shoulder External Rotation  45 Degrees   in supine   Right Shoulder Horizontal ABduction  0 Degrees    Right Shoulder Horizontal  ADduction  80  Degrees                   [facility] Adult PT Treatment/Exercise - 06/08/18 0001      Shoulder Exercises: Seated   Other Seated Exercises  arm movements with rotation from in front ofbody withstating position   like holding and small ball.       Manual Therapy   Joint Mobilization  gentle oscillations for relaxation, inferior and distraction x 50 reps     Soft tissue mobilization  STM to upper arm anterior, lateral, posterior , right pectoral, latissimus, teres major    Passive ROM  all planes  and assisted ROm in rotation and overhead.        moist heat post 10 min RT shoulder        PT Short Term Goals - 05/11/18 1300      PT SHORT TERM GOAL #1   Title  She will be independnet with initial HEP    Baseline  PT now beginning AROM 05-11-18    Time  2    Period  Weeks    Status  Achieved      PT SHORT TERM GOAL #2   Title  Passive flexion to 105 degrees     Baseline  130 flexion    Time  2    Period  Weeks    Status  Achieved      PT SHORT TERM GOAL #3   Title  passive abduction  to 95 degrees     Baseline  103    Time  2    Period  Weeks    Status  Achieved        PT Long Term Goals - 06/08/18 1142      PT LONG TERM GOAL #1   Title  She will be independent with all HEP issued    Status  On-going      PT LONG TERM GOAL #2   Title  She will be able to do all self care with RT are as normal with mild pain    Baseline  improving but limited    Status  On-going      PT LONG TERM GOAL #3   Title  She will return to  home tasks with cooking and cleaning with light to moderate tasks with 3/10 max pain    Baseline  she is doing home tasks now  limited by pain and does more with LT arm    Status  On-going      PT LONG TERM GOAL #4   Title  She will demo active ROM  with 3/10 max pain  over hea d with lifting 3 pounds to cabinet    Baseline  unable to lift weight    Status  On-going      PT LONG TERM GOAL #5   Title  "Tolerate light resistance  exercises in flexion and scaption with minimal pain    Baseline   moderate pain    Status  Partially Met            Plan - 06/08/18 1140    Clinical Impression Statement  She reports increased use of RT arm but no decr in pain. Her active ROM is better at end [facility] session and she is willing to move through pain  but we do not want to have too much pain .Marland Kitchen   Overall she is better.     PT Frequency  2x / week    PT Duration  --   5 weeks   PT Treatment/Interventions  Passive range of motion;Taping;Manual techniques;Patient/family education;Therapeutic exercise;Therapeutic activities;Cryotherapy;Moist Heat    PT Next Visit Plan  continue manual, PROM, AAROM, AROM as tolerated     PT Home Exercise Plan  Support arm at rest above heart if able ,   active elbow FLex and ext.   family STW to tolerance  beginning AROM, flexion/ER with dowel, IR stretch with pillowcase, yellow band rows , standing cane exercises    Consulted and Agree with Plan of Care  Patient    Family Member Consulted  sister in law       Patient will benefit from skilled therapeutic intervention in order to improve the following deficits and impairments:  Pain, Impaired UE functional use, Decreased strength, Decreased range of motion, Increased muscle spasms  Visit Diagnosis: Muscle weakness (generalized)  Right shoulder pain, unspecified chronicity  Stiffness of right shoulder, not elsewhere classified  Problem List There are no active problems to display for this patient.   Darrel Hoover  PT 06/08/2018, 11:44 AM  [facility] [address] [place], [place], [address] Phone: [phone]   Fax:  [phone]  Name: Jo Brown MRN: [id number] Date of Birth: Jan 26, 1934

## 2018-06-12 ENCOUNTER — Encounter: Payer: Self-pay | Admitting: Physical Therapy

## 2018-06-12 ENCOUNTER — Ambulatory Visit: Payer: Medicare Other | Admitting: Physical Therapy

## 2018-06-12 DIAGNOSIS — M25611 Stiffness of right shoulder, not elsewhere classified: Secondary | ICD-10-CM

## 2018-06-12 DIAGNOSIS — M6281 Muscle weakness (generalized): Secondary | ICD-10-CM

## 2018-06-12 DIAGNOSIS — M25511 Pain in right shoulder: Secondary | ICD-10-CM

## 2018-06-12 NOTE — Therapy (Signed)
[place], [place], [address] Phone: [phone]   Fax:  [phone]  Physical Therapy Treatment  Patient Details  Name: Jo Brown MRN: [id number] Date of Birth: Dec 09, 1933 Referring Provider (PT): Victorino December, MD   Encounter Date: 06/12/2018  PT End of Session - 06/12/18 1023    Visit Number  13    Number of Visits  22    Date for PT Re-Evaluation  07/28/18    Authorization Type  medicare/medicaid     PT Start Time  1017    PT Stop Time  1110    PT Time Calculation (min)  53 min       Past Medical History:  Diagnosis Date  . Hypertension     Past Surgical History:  Procedure Laterality Date  . EYE SURGERY      There were no vitals filed for this visit.  Subjective Assessment - 06/12/18 1023    Subjective  Shoulder hurts more with lowering it rather than raising it.     Currently in Pain?  No/denies         [facility] PT Assessment - 06/12/18 0001      PROM   Right Shoulder Flexion  135 Degrees    Right Shoulder ABduction  110 Degrees    Right Shoulder Internal Rotation  70 Degrees    Right Shoulder External Rotation  35 Degrees                   [facility] Adult PT Treatment/Exercise - 06/12/18 0001      Shoulder Exercises: Supine   Other Supine Exercises  cane pullovers       Shoulder Exercises: Sidelying   External Rotation  10 reps;AROM    External Rotation Weight (lbs)  1    External Rotation Limitations  one set with 1#     ABduction  10 reps;AROM      Shoulder Exercises: Standing   Extension  15 reps    Theraband Level (Shoulder Extension)  Level 1 (Yellow)    Row  15 reps    Theraband Level (Shoulder Row)  Level 1 (Yellow)    Other Standing Exercises  Standing cane flexion, scaption, extension, IR behind, ER in front  x 10 each , pullovers with cane     Other Standing Exercises  ball on wall circles 75 degrees, 85 degrees flexion 2 bouts each       Shoulder  Exercises: Pulleys   Flexion  2 minutes      Shoulder Exercises: ROM/Strengthening   UBE (Upper Arm Bike)  New UBE 2 min forward, 2 min back     Other ROM/Strengthening Exercises  standing cabinet reach with cone to bottom shelf. x 10, #1       Manual Therapy   Passive ROM  all planes  and assisted ROm in rotation and overhead.                PT Short Term Goals - 05/11/18 1300      PT SHORT TERM GOAL #1   Title  She will be independnet with initial HEP    Baseline  PT now beginning AROM 05-11-18    Time  2    Period  Weeks    Status  Achieved      PT SHORT TERM GOAL #2   Title  Passive flexion to 105 degrees     Baseline  130 flexion  Time  2    Period  Weeks    Status  Achieved      PT SHORT TERM GOAL #3   Title  passive abduction  to 95 degrees     Baseline  103    Time  2    Period  Weeks    Status  Achieved        PT Long Term Goals - 06/08/18 1142      PT LONG TERM GOAL #1   Title  She will be independent with all HEP issued    Status  On-going      PT LONG TERM GOAL #2   Title  She will be able to do all self care with RT are as normal with mild pain    Baseline  improving but limited    Status  On-going      PT LONG TERM GOAL #3   Title  She will return to  home tasks with cooking and cleaning with light to moderate tasks with 3/10 max pain    Baseline  she is doing home tasks now  limited by pain and does more with LT arm    Status  On-going      PT LONG TERM GOAL #4   Title  She will demo active ROM  with 3/10 max pain  over hea d with lifting 3 pounds to cabinet    Baseline  unable to lift weight    Status  On-going      PT LONG TERM GOAL #5   Title  "Tolerate light resistance exercises in flexion and scaption with minimal pain    Baseline   moderate pain    Status  Partially Met            Plan - 06/12/18 1053    Clinical Impression Statement  She reports improved AROM however she continues to have pain with eccentric  lowering. PROM improving slowly. AAROM improved with cane. Increased tolerance to UBE- able to use our newerUBE today which has more resitance.     PT Home Exercise Plan  Support arm at rest above heart if able ,   active elbow FLex and ext.   family STW to tolerance  beginning AROM, flexion/ER with dowel, IR stretch with pillowcase, yellow band rows , standing cane exercises    Consulted and Agree with Plan of Care  Patient       Patient will benefit from skilled therapeutic intervention in order to improve the following deficits and impairments:  Pain, Impaired UE functional use, Decreased strength, Decreased range of motion, Increased muscle spasms  Visit Diagnosis: Muscle weakness (generalized)  Right shoulder pain, unspecified chronicity  Stiffness of right shoulder, not elsewhere classified     Problem List There are no active problems to display for this patient.   Hessie Diener [place], [place] 06/12/2018, 11:13 AM  [facility] [address] [place], [place], [address] Phone: [phone]   Fax:  [phone]  Name: Jo Brown MRN: [id number] Date of Birth: 1933/06/14

## 2018-06-15 ENCOUNTER — Ambulatory Visit: Payer: Medicare Other

## 2018-06-15 DIAGNOSIS — M25511 Pain in right shoulder: Secondary | ICD-10-CM

## 2018-06-15 DIAGNOSIS — M25611 Stiffness of right shoulder, not elsewhere classified: Secondary | ICD-10-CM

## 2018-06-15 DIAGNOSIS — M6281 Muscle weakness (generalized): Secondary | ICD-10-CM

## 2018-06-15 NOTE — Therapy (Signed)
[facility] [address] [place], [place], [address] Phone: [phone]   Fax:  [phone]  Physical Therapy Treatment  Patient Details  Name: Jo Brown MRN: [id number] Date of Birth: [birth year] Referring Provider (PT): Duwayne Heck, MD   Encounter Date: 06/15/2018  PT End of Session - 06/15/18 1013    Visit Number  14    Number of Visits  22    Date for PT Re-Evaluation  07/28/18    Authorization Type  medicare/medicaid     PT Start Time  1015    PT Stop Time  1110    PT Time Calculation (min)  55 min    Activity Tolerance  Patient tolerated treatment well;Patient limited by pain    Behavior During Therapy  [facility] for tasks assessed/performed       Past Medical History:  Diagnosis Date  . Hypertension     Past Surgical History:  Procedure Laterality Date  . EYE SURGERY      There were no vitals filed for this visit.  Subjective Assessment - 06/15/18 1020    Subjective  Hurts moving RT arm. Buit she feels  a little better.    Patient is accompained by:  Interpreter;Family member    Pain Score  2     Pain Location  Shoulder    Pain Orientation  Right    Pain Descriptors / Indicators  Sore    Pain Type  --   sub acute   Pain Onset  More than a month ago    Pain Frequency  Intermittent    Aggravating Factors   using RT arm    Pain Relieving Factors  rest    Multiple Pain Sites  No         [facility] PT Assessment - 06/15/18 0001      AROM   Right Shoulder Extension  42 Degrees    Right Shoulder Flexion  105 Degrees    Right Shoulder ABduction  105 Degrees    Right Shoulder Internal Rotation  45 Degrees   sitting   Right Shoulder External Rotation  45 Degrees   sitting   Right Shoulder Horizontal ABduction  3 Degrees    Right Shoulder Horizontal  ADduction  84 Degrees                   [facility] Adult PT Treatment/Exercise - 06/15/18 0001      Shoulder Exercises: Standing   External Rotation   Right;15 reps    Theraband Level (Shoulder External Rotation)  Level 1 (Yellow)    Internal Rotation  Right;15 reps    Theraband Level (Shoulder Internal Rotation)  Level 1 (Yellow)    Flexion  Right;15 reps    Shoulder Flexion Weight (lbs)  2    Flexion Limitations  assisted wall slides    Extension  15 reps    Theraband Level (Shoulder Extension)  Level 1 (Yellow)    Row  15 reps    Theraband Level (Shoulder Row)  Level 1 (Yellow)    Other Standing Exercises  UE ranger flexion x 15 aqnd hor add/abd x 12    Other Standing Exercises  ACTive all planes x 10-12 reps, reaching behind head and RT hip  ALSO      Manual Therapy   Soft tissue mobilization  STM to upper arm anterior, lateral, posterior , right pectoral, latissimus, teres major    Passive ROM  all planes  and assisted ROm  in rotation and overhead.                PT Short Term Goals - 05/11/18 1300      PT SHORT TERM GOAL #1   Title  She will be independnet with initial HEP    Baseline  PT now beginning AROM 05-11-18    Time  2    Period  Weeks    Status  Achieved      PT SHORT TERM GOAL #2   Title  Passive flexion to 105 degrees     Baseline  130 flexion    Time  2    Period  Weeks    Status  Achieved      PT SHORT TERM GOAL #3   Title  passive abduction  to 95 degrees     Baseline  103    Time  2    Period  Weeks    Status  Achieved        PT Long Term Goals - 06/15/18 1107      PT LONG TERM GOAL #1   Title  She will be independent with all HEP issued    Status  On-going      PT LONG TERM GOAL #2   Title  She will be able to do all self care with RT are as normal with mild pain    Baseline  improving but limited    Status  On-going      PT LONG TERM GOAL #3   Title  She will return to  home tasks with cooking and cleaning with light to moderate tasks with 3/10 max pain    Baseline  she is doing home tasks now  limited by pain and does more with LT arm    Status  On-going      PT LONG TERM  GOAL #4   Title  She will demo active ROM  with 3/10 max pain  over hea d with lifting 3 pounds to cabinet    Baseline  unable to lift weight over head     Status  On-going      PT LONG TERM GOAL #5   Title  "Tolerate light resistance exercises in flexion and scaption with minimal pain    Baseline  min to moderate    Status  On-going            Plan - 06/15/18 1014    Clinical Impression Statement  Continues with stiffness and high pain levels with movement but her AROM is better and she tolerates resistance below  shoulder height.   Pain limits progress    PT Treatment/Interventions  Passive range of motion;Taping;Manual techniques;Patient/family education;Therapeutic exercise;Therapeutic activities;Cryotherapy;Moist Heat    PT Next Visit Plan  continue manual, PROM, AAROM, AROM as tolerated , light resistance    PT Home Exercise Plan  Support arm at rest above heart if able ,   active elbow FLex and ext.   family STW to tolerance  beginning AROM, flexion/ER with dowel, IR stretch with pillowcase, yellow band rows , standing cane exercises    Consulted and Agree with Plan of Care  Patient    Family Member Consulted  sister in law       Patient will benefit from skilled therapeutic intervention in order to improve the following deficits and impairments:  Pain, Impaired UE functional use, Decreased strength, Decreased range of motion, Increased muscle spasms  Visit Diagnosis: Muscle weakness (generalized)  Right shoulder pain, unspecified chronicity  Stiffness of right shoulder, not elsewhere classified     Problem List There are no active problems to display for this patient.   Caprice Red  PT 06/15/2018, 11:09 AM  [facility] [address] [place], [place], [address] Phone: [phone]   Fax:  [phone]  Name: Jo Brown MRN: [id number] Date of Birth: May 22, 1933

## 2018-06-19 ENCOUNTER — Encounter: Payer: Self-pay | Admitting: Physical Therapy

## 2018-06-19 ENCOUNTER — Ambulatory Visit: Payer: Medicare Other | Attending: Orthopedic Surgery | Admitting: Physical Therapy

## 2018-06-19 DIAGNOSIS — M25511 Pain in right shoulder: Secondary | ICD-10-CM | POA: Diagnosis present

## 2018-06-19 DIAGNOSIS — M6281 Muscle weakness (generalized): Secondary | ICD-10-CM | POA: Insufficient documentation

## 2018-06-19 DIAGNOSIS — M25611 Stiffness of right shoulder, not elsewhere classified: Secondary | ICD-10-CM | POA: Diagnosis present

## 2018-06-19 NOTE — Therapy (Signed)
[name] [address] [place], [place], [address] Phone: [phone]   Fax:  [phone]  Physical Therapy Treatment  Patient Details  Name: Jo Brown MRN: [id number] Date of Birth: Aug 30, 1933 Referring Provider (PT): Duwayne Heck, MD   Encounter Date: 06/19/2018  PT End of Session - 06/19/18 1021    Visit Number  15    Number of Visits  22    Date for PT Re-Evaluation  07/28/18    Authorization Type  medicare/medicaid     PT Start Time  1015    PT Stop Time  1110    PT Time Calculation (min)  55 min       Past Medical History:  Diagnosis Date  . Hypertension     Past Surgical History:  Procedure Laterality Date  . EYE SURGERY      There were no vitals filed for this visit.  Subjective Assessment - 06/19/18 1019    Subjective  Hurts when coming down after raising     Currently in Pain?  Yes    Pain Score  3    with lowering, no pain under shoulder height   Pain Location  Shoulder   heavy, holding down   Pain Orientation  Right    Pain Descriptors / Indicators  Sore    Aggravating Factors   raising, using arm above shoulder height, lowering arm     Pain Relieving Factors  rest          [facility] PT Assessment - 06/19/18 0001      AROM   Right Shoulder Flexion  100 Degrees    Right Shoulder ABduction  108 Degrees                   [facility] Adult PT Treatment/Exercise - 06/19/18 0001      Shoulder Exercises: Sidelying   ABduction  10 reps;AROM      Shoulder Exercises: Standing   Horizontal ABduction Limitations  difficult with yellow band     External Rotation Limitations  unilateral and bilateral     Internal Rotation  Right;15 reps    Theraband Level (Shoulder Internal Rotation)  Level 1 (Yellow)    Extension  20 reps    Theraband Level (Shoulder Extension)  Level 2 (Red)    Row  20 reps    Theraband Level (Shoulder Row)  Level 2 (Red)    Other Standing Exercises  UE ranger flexion x  15 aqnd hor add/abd x 12    Other Standing Exercises  cabinet reach mid shelf with cone, wall wash with pillow case in flexion, bent over at counter horizontal abduction (difficult-cues for scap queeze), extension      Shoulder Exercises: Pulleys   Flexion  2 minutes      Shoulder Exercises: ROM/Strengthening   UBE (Upper Arm Bike)  New UBE 2.5 min forward, 2.5 min back       Modalities   Modalities  Moist Heat      Moist Heat Therapy   Number Minutes Moist Heat  10 Minutes    Moist Heat Location  Shoulder             PT Education - 06/19/18 1047    Education Details  HEP    Person(s) Educated  Patient;Other (comment)   sister in law via interpreter   Methods  Explanation;Handout    Comprehension  Verbalized understanding       PT Short Term Goals -  05/11/18 1300      PT SHORT TERM GOAL #1   Title  She will be independnet with initial HEP    Baseline  PT now beginning AROM 05-11-18    Time  2    Period  Weeks    Status  Achieved      PT SHORT TERM GOAL #2   Title  Passive flexion to 105 degrees     Baseline  130 flexion    Time  2    Period  Weeks    Status  Achieved      PT SHORT TERM GOAL #3   Title  passive abduction  to 95 degrees     Baseline  103    Time  2    Period  Weeks    Status  Achieved        PT Long Term Goals - 06/15/18 1107      PT LONG TERM GOAL #1   Title  She will be independent with all HEP issued    Status  On-going      PT LONG TERM GOAL #2   Title  She will be able to do all self care with RT are as normal with mild pain    Baseline  improving but limited    Status  On-going      PT LONG TERM GOAL #3   Title  She will return to  home tasks with cooking and cleaning with light to moderate tasks with 3/10 max pain    Baseline  she is doing home tasks now  limited by pain and does more with LT arm    Status  On-going      PT LONG TERM GOAL #4   Title  She will demo active ROM  with 3/10 max pain  over hea d with lifting 3  pounds to cabinet    Baseline  unable to lift weight over head     Status  On-going      PT LONG TERM GOAL #5   Title  "Tolerate light resistance exercises in flexion and scaption with minimal pain    Baseline  min to moderate    Status  On-going            Plan - 06/19/18 1053    Clinical Impression Statement  Pt demonstrates decreased perscap strength. Able to progress to supine scapual band series with yellow theraband and updated HEP. ROM about the same, a little decreased in flexion today with c/o pain with eccentric lowering.     PT Next Visit Plan  continue manual, PROM, AAROM, AROM as tolerated , light resistance- reviewe supine scap stab series    PT Home Exercise Plan  Support arm at rest above heart if able ,   active elbow FLex and ext.   family STW to tolerance  beginning AROM, flexion/ER with dowel, IR stretch with pillowcase, yellow band rows , standing cane exercises    Consulted and Agree with Plan of Care  Patient    Family Member Consulted  sister in law       Patient will benefit from skilled therapeutic intervention in order to improve the following deficits and impairments:  Pain, Impaired UE functional use, Decreased strength, Decreased range of motion, Increased muscle spasms  Visit Diagnosis: Muscle weakness (generalized)  Right shoulder pain, unspecified chronicity  Stiffness of right shoulder, not elsewhere classified     Problem List There are no active problems to  display for this patient.   [name], [name] 06/19/2018, 11:22 AM  [facility] [address] [place], [place], [address] Phone: [phone]   Fax:  [phone]  Name: Jo Brown MRN: [id number] Date of Birth: December 07, 1933

## 2018-06-19 NOTE — Patient Instructions (Signed)
Over Head Pull: Narrow Grip       On back, knees bent, feet flat, band across thighs, elbows straight but relaxed. Pull hands apart (start). Keeping elbows straight, bring arms up and over head, hands toward floor. Keep pull steady on band. Hold momentarily. Return slowly, keeping pull steady, back to start. Repeat _10-20__ times. Band color ___Y___   Side Pull: Double Arm   On back, knees bent, feet flat. Arms perpendicular to body, shoulder level, elbows straight but relaxed. Pull arms out to sides, elbows straight. Resistance band comes across collarbones, hands toward floor. Hold momentarily. Slowly return to starting position. Repeat 10-20___ times. Band color ___Y__   Sash   On back, knees bent, feet flat, left hand on left hip, right hand above left. Pull right arm DIAGONALLY (hip to shoulder) across chest. Bring right arm along head toward floor. Hold momentarily. Slowly return to starting position. Repeat 10-20___ times. Do with left arm. Band color __Y____   Shoulder Rotation: Double Arm   On back, knees bent, feet flat, elbows tucked at sides, bent 90, hands palms up. Pull hands apart and down toward floor, keeping elbows near sides. Hold momentarily. Slowly return to starting position. Repeat _10-20__ times. Band color ___Y___   

## 2018-06-22 ENCOUNTER — Ambulatory Visit: Payer: Medicare Other | Admitting: Physical Therapy

## 2018-06-22 ENCOUNTER — Encounter: Payer: Self-pay | Admitting: Physical Therapy

## 2018-06-22 DIAGNOSIS — M25511 Pain in right shoulder: Secondary | ICD-10-CM

## 2018-06-22 DIAGNOSIS — M6281 Muscle weakness (generalized): Secondary | ICD-10-CM | POA: Diagnosis not present

## 2018-06-22 DIAGNOSIS — M25611 Stiffness of right shoulder, not elsewhere classified: Secondary | ICD-10-CM

## 2018-06-22 NOTE — Therapy (Signed)
[facility] [address] [place], [place], [address] Phone: [phone]   Fax:  [phone]  Physical Therapy Treatment  Patient Details  Name: Jo Brown MRN: [id number] Date of Birth: 01/28/1934 Referring Provider (PT): Duwayne Heck, MD   Encounter Date: 06/22/2018  PT End of Session - 06/22/18 1021    Visit Number  16    Number of Visits  22    Date for PT Re-Evaluation  07/28/18    Authorization Type  medicare/medicaid     PT Start Time  1017    PT Stop Time  1108    PT Time Calculation (min)  51 min       Past Medical History:  Diagnosis Date  . Hypertension     Past Surgical History:  Procedure Laterality Date  . EYE SURGERY      There were no vitals filed for this visit.                    [facility] Adult PT Treatment/Exercise - 06/22/18 0001      Shoulder Exercises: Supine   Other Supine Exercises  bicep curls 2# thumb up/thumb out     Other Supine Exercises  yellow band scap stab series x 15 -20 each       Shoulder Exercises: Standing   Horizontal ABduction Limitations  difficult with yellow band     External Rotation Limitations  unilateral and bilateral     Internal Rotation  Right;15 reps    Theraband Level (Shoulder Internal Rotation)  Level 2 (Red)    Extension  20 reps    Theraband Level (Shoulder Extension)  Level 2 (Red)    Row  20 reps    Theraband Level (Shoulder Row)  Level 2 (Red)    Other Standing Exercises  UE ranger flexion x 15 aqnd hor add/abd x 12, standing back to wall-horizontal abduction AROM trying to touch wall , attempted snow angels    Other Standing Exercises  cabinet reach mid shelf with 1#, wall wash with pillow case in flexion,      Shoulder Exercises: Pulleys   Flexion  2 minutes      Shoulder Exercises: ROM/Strengthening   UBE (Upper Arm Bike)  New UBE 2.5 min forward, 2.5 min back       Shoulder Exercises: Stretch   Corner Stretch Limitations   doorway'hands at shoulder height 10 sec x 4       Modalities   Modalities  Moist Heat      Moist Heat Therapy   Number Minutes Moist Heat  10 Minutes    Moist Heat Location  Shoulder      Manual Therapy   Passive ROM  flexion, abduction, IR. Er                PT Short Term Goals - 05/11/18 1300      PT SHORT TERM GOAL #1   Title  She will be independnet with initial HEP    Baseline  PT now beginning AROM 05-11-18    Time  2    Period  Weeks    Status  Achieved      PT SHORT TERM GOAL #2   Title  Passive flexion to 105 degrees     Baseline  130 flexion    Time  2    Period  Weeks    Status  Achieved      PT SHORT TERM GOAL #3  Title  passive abduction  to 95 degrees     Baseline  103    Time  2    Period  Weeks    Status  Achieved        PT Long Term Goals - 06/15/18 1107      PT LONG TERM GOAL #1   Title  She will be independent with all HEP issued    Status  On-going      PT LONG TERM GOAL #2   Title  She will be able to do all self care with RT are as normal with mild pain    Baseline  improving but limited    Status  On-going      PT LONG TERM GOAL #3   Title  She will return to  home tasks with cooking and cleaning with light to moderate tasks with 3/10 max pain    Baseline  she is doing home tasks now  limited by pain and does more with LT arm    Status  On-going      PT LONG TERM GOAL #4   Title  She will demo active ROM  with 3/10 max pain  over hea d with lifting 3 pounds to cabinet    Baseline  unable to lift weight over head     Status  On-going      PT LONG TERM GOAL #5   Title  "Tolerate light resistance exercises in flexion and scaption with minimal pain    Baseline  min to moderate    Status  On-going            Plan - 06/22/18 1021    Clinical Impression Statement  Pt reports pain in wrist/forearm today. She is using water bottles to mimic using UBE at home. Improvement in enduarance and eccentric contorl without pain  today. She reports using UE for washing face, applying face lotion, can touch top of head. She is still unable to wash her hair using RUE.    PT Next Visit Plan  continue manual, PROM, AAROM, AROM as tolerated , light resistance- reviewe supine scap stab series    PT Home Exercise Plan  Support arm at rest above heart if able ,   active elbow FLex and ext.   family STW to tolerance  beginning AROM, flexion/ER with dowel, IR stretch with pillowcase, yellow band rows , standing cane exercises, yellow band scap stab series     Consulted and Agree with Plan of Care  Patient    Family Member Consulted  sister in law       Patient will benefit from skilled therapeutic intervention in order to improve the following deficits and impairments:  Pain, Impaired UE functional use, Decreased strength, Decreased range of motion, Increased muscle spasms  Visit Diagnosis: Muscle weakness (generalized)  Right shoulder pain, unspecified chronicity  Stiffness of right shoulder, not elsewhere classified     Problem List There are no active problems to display for this patient.   [name], [name] 06/22/2018, 10:58 AM  [facility] [address] [place], [place], [address] Phone: [phone]   Fax:  [phone]  Name: Jo Brown MRN: [id number] Date of Birth: Dec 18, 1933

## 2018-06-26 ENCOUNTER — Ambulatory Visit: Payer: Medicare Other | Admitting: Physical Therapy

## 2018-06-26 ENCOUNTER — Encounter: Payer: Self-pay | Admitting: Physical Therapy

## 2018-06-26 DIAGNOSIS — M6281 Muscle weakness (generalized): Secondary | ICD-10-CM | POA: Diagnosis not present

## 2018-06-26 DIAGNOSIS — M25511 Pain in right shoulder: Secondary | ICD-10-CM

## 2018-06-26 DIAGNOSIS — M25611 Stiffness of right shoulder, not elsewhere classified: Secondary | ICD-10-CM

## 2018-06-26 NOTE — Therapy (Signed)
[facility] Outpatient Rehabilitation [facility] [address] [place], [place], [address] Phone: [phone]   Fax:  [phone]  Physical Therapy Treatment  Patient Details  Name: Jo Brown MRN: [id number] Date of Birth: Oct 26, 1933 Referring Provider (PT): Duwayne Heck, MD   Encounter Date: 06/26/2018  PT End of Session - 06/26/18 1020    Visit Number  17    Number of Visits  22    Date for PT Re-Evaluation  07/28/18    Authorization Type  medicare/medicaid     PT Start Time  1015    PT Stop Time  1110    PT Time Calculation (min)  55 min       Past Medical History:  Diagnosis Date  . Hypertension     Past Surgical History:  Procedure Laterality Date  . EYE SURGERY      There were no vitals filed for this visit.  Subjective Assessment - 06/26/18 1019    Subjective  Exercises are getting easier. Still have pain.     Currently in Pain?  Yes    Pain Score  2     Pain Location  Shoulder    Pain Orientation  Right    Aggravating Factors   end range reaching     Pain Relieving Factors  rest                        [facility] Adult PT Treatment/Exercise - 06/26/18 0001      Shoulder Exercises: Supine   Other Supine Exercises  yellow band scap stab series x 15 -20 each       Shoulder Exercises: Standing   Horizontal ABduction  10 reps    Theraband Level (Shoulder Horizontal ABduction)  Level 1 (Yellow)    Horizontal ABduction Limitations  small rom    External Rotation Limitations  unilateral and bilateral     Internal Rotation  Right;15 reps    Theraband Level (Shoulder Internal Rotation)  Level 2 (Red)    Extension  20 reps    Theraband Level (Shoulder Extension)  Level 2 (Red)    Row  20 reps    Theraband Level (Shoulder Row)  Level 2 (Red)    Other Standing Exercises  UE ranger flexion x 15 aqnd hor add/abd x 12, standing back to wall-horizontal abduction AROM trying to touch wall    Other Standing Exercises  cabinet reach mid  shelf with 1#, ball on wall flexion and abcution circles 1 minute each , bilateral wall slides x 15       Shoulder Exercises: Pulleys   Flexion  2 minutes      Shoulder Exercises: ROM/Strengthening   UBE (Upper Arm Bike)  New UBE3 min forward, 2.5 min back  LEvel 1      Shoulder Exercises: Stretch   Corner Stretch Limitations  doorway'hands at shoulder height 10 sec x 4       Manual Therapy   Passive ROM  flexion, abduction, IR. Er                PT Short Term Goals - 05/11/18 1300      PT SHORT TERM GOAL #1   Title  She will be independnet with initial HEP    Baseline  PT now beginning AROM 05-11-18    Time  2    Period  Weeks    Status  Achieved      PT SHORT TERM GOAL #2  Title  Passive flexion to 105 degrees     Baseline  130 flexion    Time  2    Period  Weeks    Status  Achieved      PT SHORT TERM GOAL #3   Title  passive abduction  to 95 degrees     Baseline  103    Time  2    Period  Weeks    Status  Achieved        PT Long Term Goals - 06/15/18 1107      PT LONG TERM GOAL #1   Title  She will be independent with all HEP issued    Status  On-going      PT LONG TERM GOAL #2   Title  She will be able to do all self care with RT are as normal with mild pain    Baseline  improving but limited    Status  On-going      PT LONG TERM GOAL #3   Title  She will return to  home tasks with cooking and cleaning with light to moderate tasks with 3/10 max pain    Baseline  she is doing home tasks now  limited by pain and does more with LT arm    Status  On-going      PT LONG TERM GOAL #4   Title  She will demo active ROM  with 3/10 max pain  over hea d with lifting 3 pounds to cabinet    Baseline  unable to lift weight over head     Status  On-going      PT LONG TERM GOAL #5   Title  "Tolerate light resistance exercises in flexion and scaption with minimal pain    Baseline  min to moderate    Status  On-going            Plan - 06/26/18 1249     Clinical Impression Statement  Pt reports less pain and is able to use UE more for low level hom tasks and self care. Still pain and fatigue with activies above shoulder height. She is progressing slowly. She is very consistent with HEP. Increased pain with PROM.     PT Next Visit Plan  continue manual, PROM, AAROM, AROM as tolerated , light resistance- reviewe supine scap stab series    PT Home Exercise Plan  Support arm at rest above heart if able ,   active elbow FLex and ext.   family STW to tolerance  beginning AROM, flexion/ER with dowel, IR stretch with pillowcase, yellow band rows , standing cane exercises, yellow band scap stab series        Patient will benefit from skilled therapeutic intervention in order to improve the following deficits and impairments:  Pain, Impaired UE functional use, Decreased strength, Decreased range of motion, Increased muscle spasms  Visit Diagnosis: Muscle weakness (generalized)  Stiffness of right shoulder, not elsewhere classified  Right shoulder pain, unspecified chronicity     Problem List There are no active problems to display for this patient.   [name], [name] 06/26/2018, 12:50 PM  [facility] [address] [place], [place], [address] Phone: [phone]   Fax:  [phone]  Name: Jo Brown MRN: [id number] Date of Birth: [birth year]

## 2018-06-29 ENCOUNTER — Ambulatory Visit: Payer: Medicare Other | Admitting: Physical Therapy

## 2018-06-29 ENCOUNTER — Other Ambulatory Visit: Payer: Self-pay

## 2018-06-29 ENCOUNTER — Encounter: Payer: Self-pay | Admitting: Physical Therapy

## 2018-06-29 DIAGNOSIS — M25611 Stiffness of right shoulder, not elsewhere classified: Secondary | ICD-10-CM

## 2018-06-29 DIAGNOSIS — M6281 Muscle weakness (generalized): Secondary | ICD-10-CM | POA: Diagnosis not present

## 2018-06-29 DIAGNOSIS — M25511 Pain in right shoulder: Secondary | ICD-10-CM

## 2018-06-29 NOTE — Therapy (Signed)
[place], [place], [address] Phone: [phone]   Fax:  [phone]  Physical Therapy Treatment  Patient Details  Name: Jo Brown MRN: [id number] Date of Birth: 11-28-1933 Referring Provider (PT): Victorino December, MD   Encounter Date: 06/29/2018  PT End of Session - 06/29/18 1027    Visit Number  18    Number of Visits  22    Date for PT Re-Evaluation  07/28/18    Authorization Type  medicare/medicaid     PT Start Time  1015    PT Stop Time  1115    PT Time Calculation (min)  60 min       Past Medical History:  Diagnosis Date  . Hypertension     Past Surgical History:  Procedure Laterality Date  . EYE SURGERY      There were no vitals filed for this visit.  Subjective Assessment - 06/29/18 1026    Subjective  No change     Currently in Pain?  Yes    Pain Score  2     Pain Location  Shoulder    Pain Orientation  Right    Pain Descriptors / Indicators  Sore         [facility] PT Assessment - 06/29/18 0001      AROM   Right Shoulder Flexion  115 Degrees    Right Shoulder ABduction  115 Degrees    Right Shoulder Internal Rotation  --   reach to tailbone    Right Shoulder External Rotation  --   reach back of neck                   [facility] Adult PT Treatment/Exercise - 06/29/18 0001      Shoulder Exercises: Supine   Other Supine Exercises  1# flexion supine       Shoulder Exercises: Sidelying   External Rotation  20 reps    External Rotation Weight (lbs)  2      Shoulder Exercises: Standing   Horizontal ABduction  10 reps    Theraband Level (Shoulder Horizontal ABduction)  Level 1 (Yellow)    Horizontal ABduction Limitations  small rom    Flexion Limitations  forward and lateral raises aith 1# 10 x 2 each     Extension  20 reps    Theraband Level (Shoulder Extension)  Level 3 (Green)    Row  20 reps    Theraband Level (Shoulder Row)  Level 3 (Green)    Other Standing  Exercises  UE ranger flexion x 15 aqnd hor add/abd x 12, standing back to wall-horizontal abduction AROM trying to touch wall, standing cane IR, extension, bicep curls 3#     Other Standing Exercises  cabinet reach mid shelf with 1#, ball on wall flexion and abcution circles 1 minute each , bilateral wall slides x 15       Shoulder Exercises: ROM/Strengthening   UBE (Upper Arm Bike)  New UBE3 min forward, 2.5 min back  LEvel 1      Shoulder Exercises: Stretch   Corner Stretch Limitations  doorway'hands at shoulder height 10 sec x 4       Moist Heat Therapy   Number Minutes Moist Heat  10 Minutes    Moist Heat Location  Shoulder               PT Short Term Goals - 05/11/18 1300      PT SHORT  TERM GOAL #1   Title  She will be independnet with initial HEP    Baseline  PT now beginning AROM 05-11-18    Time  2    Period  Weeks    Status  Achieved      PT SHORT TERM GOAL #2   Title  Passive flexion to 105 degrees     Baseline  130 flexion    Time  2    Period  Weeks    Status  Achieved      PT SHORT TERM GOAL #3   Title  passive abduction  to 95 degrees     Baseline  103    Time  2    Period  Weeks    Status  Achieved        PT Long Term Goals - 06/29/18 1029      PT LONG TERM GOAL #1   Title  She will be independent with all HEP issued    Baseline  independent thus far    Time  6    Period  Weeks    Status  On-going      PT LONG TERM GOAL #2   Title  She will be able to do all self care with RT are as normal with mild pain    Baseline  improved with washing face , can reach head but not use UE for washing/combing hair    Time  6    Period  Weeks    Status  On-going      PT LONG TERM GOAL #3   Title  She will return to  home tasks with cooking and cleaning with light to moderate tasks with 3/10 max pain    Baseline  light tasks below shoulder height- she is able, difficulty with above shoulder height     Time  6    Period  Weeks    Status  On-going       PT LONG TERM GOAL #4   Title  She will demo active ROM  with 3/10 max pain  over hea d with lifting 3 pounds to cabinet    Baseline  can lift 1# to low shelf     Time  6    Period  Weeks    Status  On-going      PT LONG TERM GOAL #5   Title  "Tolerate light resistance exercises in flexion and scaption with minimal pain    Baseline  improved tolerance, min pain    Time  6    Period  Weeks    Status  Achieved            Plan - 06/29/18 1027    Clinical Impression Statement  Continued with functional strength and reaching activities to improve strength and function. Pt tolerated well with fatigue including improved tolerance to resistance in flexion and scaption. LTG# 5 met.     PT Next Visit Plan  continue manual, PROM, AAROM, AROM as tolerated , light resistance- reviewe supine scap stab series    PT Home Exercise Plan  Support arm at rest above heart if able ,   active elbow FLex and ext.   family STW to tolerance  beginning AROM, flexion/ER with dowel, IR stretch with pillowcase, yellow band rows , standing cane exercises, yellow band scap stab series     Consulted and Agree with Plan of Care  Patient       Patient will benefit  from skilled therapeutic intervention in order to improve the following deficits and impairments:  Pain, Impaired UE functional use, Decreased strength, Decreased range of motion, Increased muscle spasms  Visit Diagnosis: Muscle weakness (generalized)  Stiffness of right shoulder, not elsewhere classified  Right shoulder pain, unspecified chronicity     Problem List There are no active problems to display for this patient.   Dorene Ar, [place] 06/29/2018, 11:04 AM  [facility] [address] [place], [place], [address] Phone: [phone]   Fax:  [phone]  Name: Mesa Janus MRN: [id number] Date of Birth: 02-28-1934

## 2018-07-03 ENCOUNTER — Encounter: Payer: Self-pay | Admitting: Physical Therapy

## 2018-07-03 ENCOUNTER — Ambulatory Visit: Payer: Medicare Other | Admitting: Physical Therapy

## 2018-07-03 ENCOUNTER — Other Ambulatory Visit: Payer: Self-pay

## 2018-07-03 DIAGNOSIS — M25511 Pain in right shoulder: Secondary | ICD-10-CM

## 2018-07-03 DIAGNOSIS — M25611 Stiffness of right shoulder, not elsewhere classified: Secondary | ICD-10-CM

## 2018-07-03 DIAGNOSIS — M6281 Muscle weakness (generalized): Secondary | ICD-10-CM

## 2018-07-03 NOTE — Therapy (Addendum)
[place], [place], [address] Phone: [phone]   Fax:  [phone]  Physical Therapy Treatment/Discharge  Patient Details  Name: Jo Brown MRN: [id number] Date of Birth: 1933/08/28 Referring Provider (PT): Victorino December, MD   Encounter Date: 07/03/2018  PT End of Session - 07/03/18 1012    Visit Number  19    Number of Visits  22    Date for PT Re-Evaluation  07/28/18    Authorization Type  medicare/medicaid     PT Start Time  1010    PT Stop Time  1103    PT Time Calculation (min)  53 min       Past Medical History:  Diagnosis Date  . Hypertension     Past Surgical History:  Procedure Laterality Date  . EYE SURGERY      There were no vitals filed for this visit.  Subjective Assessment - 07/03/18 1013    Subjective  No pain.     Currently in Pain?  No/denies                       [facility] Adult PT Treatment/Exercise - 07/03/18 0001      Shoulder Exercises: Supine   Other Supine Exercises  1# flexion supine     Other Supine Exercises  red band scap stab series x 15 -20 each       Shoulder Exercises: Sidelying   External Rotation  20 reps    External Rotation Weight (lbs)  2    ABduction  10 reps    ABduction Weight (lbs)  1      Shoulder Exercises: Standing   Horizontal ABduction  10 reps    Theraband Level (Shoulder Horizontal ABduction)  Level 1 (Yellow)    Horizontal ABduction Limitations  small rom    External Rotation Limitations  unilateral and bilateral     Internal Rotation  Right;15 reps    Theraband Level (Shoulder Internal Rotation)  Level 2 (Red)    Flexion Limitations  forward and lateral raises aith 1# 10 x 2 each     Row  20 reps    Theraband Level (Shoulder Row)  Level 3 (Green)    Other Standing Exercises  UE ranger flexion x 15 aqnd hor add/abd x 12, standing back to wall-horizontal abduction AROM trying to touch wall, standing cane IR, extension,  bicep curls 3#     Other Standing Exercises  cabinet reach mid shelf with 1#, bottom shelf 2# , ball on wall flexion and abcution circles 1 minute each , bilateral wall slides x 15       Shoulder Exercises: Pulleys   Flexion  2 minutes      Shoulder Exercises: ROM/Strengthening   UBE (Upper Arm Bike)  New UBE3 min forward, 3 min back  LEvel 1      Shoulder Exercises: Stretch   Corner Stretch Limitations  doorway'hands at shoulder height 10 sec x 4       Moist Heat Therapy   Number Minutes Moist Heat  10 Minutes    Moist Heat Location  Shoulder      Manual Therapy   Passive ROM  flexion, abduction, IR. Er                PT Short Term Goals - 05/11/18 1300      PT SHORT TERM GOAL #1   Title  She will be independnet with initial  HEP    Baseline  PT now beginning AROM 05-11-18    Time  2    Period  Weeks    Status  Achieved      PT SHORT TERM GOAL #2   Title  Passive flexion to 105 degrees     Baseline  130 flexion    Time  2    Period  Weeks    Status  Achieved      PT SHORT TERM GOAL #3   Title  passive abduction  to 95 degrees     Baseline  103    Time  2    Period  Weeks    Status  Achieved        PT Long Term Goals - 06/29/18 1029      PT LONG TERM GOAL #1   Title  She will be independent with all HEP issued    Baseline  independent thus far    Time  6    Period  Weeks    Status  On-going      PT LONG TERM GOAL #2   Title  She will be able to do all self care with RT are as normal with mild pain    Baseline  improved with washing face , can reach head but not use UE for washing/combing hair    Time  6    Period  Weeks    Status  On-going      PT LONG TERM GOAL #3   Title  She will return to  home tasks with cooking and cleaning with light to moderate tasks with 3/10 max pain    Baseline  light tasks below shoulder height- she is able, difficulty with above shoulder height     Time  6    Period  Weeks    Status  On-going      PT LONG TERM  GOAL #4   Title  She will demo active ROM  with 3/10 max pain  over hea d with lifting 3 pounds to cabinet    Baseline  can lift 1# to low shelf     Time  6    Period  Weeks    Status  On-going      PT LONG TERM GOAL #5   Title  "Tolerate light resistance exercises in flexion and scaption with minimal pain    Baseline  improved tolerance, min pain    Time  6    Period  Weeks    Status  Achieved            Plan - 07/03/18 1052    Clinical Impression Statement  Improved tolerance to weighted cabinet reaching and overall endurance to strengthening, Still has pain with PROM and soft end feels.     PT Home Exercise Plan  Support arm at rest above heart if able ,   active elbow FLex and ext.   family STW to tolerance  beginning AROM, flexion/ER with dowel, IR stretch with pillowcase, yellow band rows , standing cane exercises, yellow band scap stab series     Consulted and Agree with Plan of Care  Patient       Patient will benefit from skilled therapeutic intervention in order to improve the following deficits and impairments:  Pain, Impaired UE functional use, Decreased strength, Decreased range of motion, Increased muscle spasms  Visit Diagnosis: Muscle weakness (generalized)  Stiffness of right shoulder, not elsewhere classified  Right shoulder  pain, unspecified chronicity     Problem List There are no active problems to display for this patient.   Dorene Ar, [place] 07/03/2018, 10:54 AM  [facility] [address] [place], [place], [address] Phone: [phone]   Fax:  [phone]  Name: Jo Brown MRN: [id number] Date of Birth: 08/21/1933  PHYSICAL THERAPY DISCHARGE SUMMARY  Visits from Start of Care: 19 Current functional level related to goals / functional outcomes:   Unknown as she canceled multiple times then Covid-19 started and we were closed  Remaining deficits: Unknown   Education /  Equipment: HEP Plan:                                                    Patient goals were partially met. Patient is being discharged due to not returning since the last visit.  ?????    Pearson Forster PT  01/02/19

## 2018-07-06 ENCOUNTER — Ambulatory Visit: Payer: Medicare Other | Admitting: Physical Therapy

## 2018-07-06 ENCOUNTER — Other Ambulatory Visit: Payer: Self-pay

## 2018-07-06 DIAGNOSIS — M25511 Pain in right shoulder: Secondary | ICD-10-CM

## 2018-07-06 DIAGNOSIS — M6281 Muscle weakness (generalized): Secondary | ICD-10-CM

## 2018-07-06 DIAGNOSIS — M25611 Stiffness of right shoulder, not elsewhere classified: Secondary | ICD-10-CM

## 2018-07-06 NOTE — Therapy (Signed)
[facility] [address] [place], [place], [address] Phone: [phone]   Fax:  [phone]  Physical Therapy Treatment  Patient Details  Name: Jo Brown MRN: [id number] Date of Birth: 1933/08/02 Referring Provider (PT): Duwayne Heck, MD   Encounter Date: 07/06/2018  PT End of Session - 07/06/18 1204    Visit Number  20    Number of Visits  22    Date for PT Re-Evaluation  07/28/18    Authorization Type  medicare/medicaid     PT Start Time  1017    PT Stop Time  1100    PT Time Calculation (min)  43 min    Activity Tolerance  Patient tolerated treatment well    Behavior During Therapy  [facility] for tasks assessed/performed       Past Medical History:  Diagnosis Date  . Hypertension     Past Surgical History:  Procedure Laterality Date  . EYE SURGERY      There were no vitals filed for this visit.    Exercises today  Wall Angels - 10 reps - 2 sets Standing shoulder flexion wall slides with extension off wall X 10 Standing Lat Pull Down with Resistance  Yellow 2X10 Standing Shoulder Single Arm PNFD1 and  D2 Flexion with yellow Resistance - 10 reps  Wall push ups 2 sets of 10 Shoulder star yellow band X 5 Pulleys 2 min flexion UBE L3 3 min fwd, 3 min back UE ranger X 15 flexion, X 15 scaption Doorway stretch 3 X 20 sec   PT Education - 07/06/18 1203    Education Details  updated HEP in the event she does not return to PT    Person(s) Educated  Patient    Methods  Explanation;Demonstration;Handout;Verbal cues    Comprehension  Verbalized understanding;Returned demonstration       PT Short Term Goals - 05/11/18 1300      PT SHORT TERM GOAL #1   Title  She will be independnet with initial HEP    Baseline  PT now beginning AROM 05-11-18    Time  2    Period  Weeks    Status  Achieved      PT SHORT TERM GOAL #2   Title  Passive flexion to 105 degrees     Baseline  130 flexion    Time  2    Period  Weeks     Status  Achieved      PT SHORT TERM GOAL #3   Title  passive abduction  to 95 degrees     Baseline  103    Time  2    Period  Weeks    Status  Achieved        PT Long Term Goals - 06/29/18 1029      PT LONG TERM GOAL #1   Title  She will be independent with all HEP issued    Baseline  independent thus far    Time  6    Period  Weeks    Status  On-going      PT LONG TERM GOAL #2   Title  She will be able to do all self care with RT are as normal with mild pain    Baseline  improved with washing face , can reach head but not use UE for washing/combing hair    Time  6    Period  Weeks    Status  On-going  PT LONG TERM GOAL #3   Title  She will return to  home tasks with cooking and cleaning with light to moderate tasks with 3/10 max pain    Baseline  light tasks below shoulder height- she is able, difficulty with above shoulder height     Time  6    Period  Weeks    Status  On-going      PT LONG TERM GOAL #4   Title  She will demo active ROM  with 3/10 max pain  over hea d with lifting 3 pounds to cabinet    Baseline  can lift 1# to low shelf     Time  6    Period  Weeks    Status  On-going      PT LONG TERM GOAL #5   Title  "Tolerate light resistance exercises in flexion and scaption with minimal pain    Baseline  improved tolerance, min pain    Time  6    Period  Weeks    Status  Achieved            Plan - 07/06/18 1204    Clinical Impression Statement  Session focused on ROM and strength today with progression of HEP as asked by patient as she may not return due to the virus. She was given new print out of HEP and was taken through these today where she showed good understanding and return demonstration    Rehab Potential  Good    PT Frequency  2x / week    PT Treatment/Interventions  Passive range of motion;Taping;Manual techniques;Patient/family education;Therapeutic exercise;Therapeutic activities;Cryotherapy;Moist Heat    PT Next Visit Plan   continue manual, PROM, AAROM, AROM as tolerated , light resistance- reviewe supine scap stab series    PT Home Exercise Plan  Support arm at rest above heart if able ,   active elbow FLex and ext.   family STW to tolerance  beginning AROM, flexion/ER with dowel, IR stretch with pillowcase, yellow band rows , standing cane exercises, yellow band scap stab series, added D1 and D2 flexion, wall slides, wall angels, wall push ups, shoulder star, lat pull    Consulted and Agree with Plan of Care  Patient    Family Member Consulted  sister in law       Patient will benefit from skilled therapeutic intervention in order to improve the following deficits and impairments:  Pain, Impaired UE functional use, Decreased strength, Decreased range of motion, Increased muscle spasms  Visit Diagnosis: Muscle weakness (generalized)  Stiffness of right shoulder, not elsewhere classified  Right shoulder pain, unspecified chronicity     Problem List There are no active problems to display for this patient.   Jo Brown 07/06/2018, 12:08 PM  [facility] [address] [place], [place], [address] Phone: [phone]   Fax:  [phone]  Name: Jo Brown MRN: [id number] Date of Birth: Oct 05, 1933

## 2018-07-06 NOTE — Patient Instructions (Addendum)
Access Code: [email or web address]  URL: [email or web address]  Date: 07/06/2018  Prepared by: Ivery Quale   Exercises  Wall Angels - 10 reps - 1-2 sets - 2x daily - 6x weekly  Standing shoulder flexion wall slides - 10 reps - 1-2 sets - 2x daily - 6x weekly  Standing Lat Pull Down with Resistance - Elbows Bent - 10 reps - 3 sets - 2x daily - 6x weekly  Standing Shoulder Single Arm PNF D2 Flexion with Resistance - 10 reps - 3 sets - 2x daily - 6x weekly  Wall push ups 2 sets of 10 Shoulder star yellow band X 5

## 2018-07-10 ENCOUNTER — Ambulatory Visit: Payer: Medicare Other | Admitting: Physical Therapy

## 2018-07-13 ENCOUNTER — Ambulatory Visit: Payer: Medicare Other | Admitting: Physical Therapy

## 2018-07-17 ENCOUNTER — Ambulatory Visit: Payer: Medicare Other

## 2018-07-20 ENCOUNTER — Ambulatory Visit: Payer: Medicare Other | Admitting: Physical Therapy

## 2018-07-24 ENCOUNTER — Ambulatory Visit: Payer: Medicare Other | Admitting: Physical Therapy

## 2018-07-25 ENCOUNTER — Telehealth: Payer: Self-pay | Admitting: Physical Therapy

## 2018-07-25 NOTE — Telephone Encounter (Signed)
She  was contacted today with voicemail left via interpreter service Jonny Ruiz [phone] for [place] language) regarding temporary reduction of Outpatient Rehabilitation Services at [name] due to concerns for community transmission of COVID-19.  An interpreter was required for this call and he let her know that we will have to cancel her remaining appointments at this time until we reopen and in the meantime we are offering telehealth visits if she is interested in continuing PT this way. She was made aware we can be reached by telephone during limited business hours in the meantime.   Ivery Quale, PT, DPT 07/25/18 11:52 AM

## 2018-08-01 ENCOUNTER — Ambulatory Visit: Payer: Self-pay

## 2018-08-03 ENCOUNTER — Encounter: Payer: Medicare Other | Admitting: Physical Therapy

## 2018-08-03 ENCOUNTER — Telehealth: Payer: Self-pay | Admitting: Physical Therapy

## 2018-08-03 NOTE — Telephone Encounter (Signed)
Left message by Lanora Manis [phone] through interpreter services- [employer]. Advised of June 1st open date, insurance does not cover Telehealth- MCR. Advised her to contact us with any questions. Abrea Henle C. Brenin Heidelberger PT, DPT 08/03/18 1:33 PM

## 2018-08-07 ENCOUNTER — Encounter: Payer: Medicare Other | Admitting: Physical Therapy

## 2018-08-10 ENCOUNTER — Encounter: Payer: Medicare Other | Admitting: Physical Therapy

## 2018-08-23 ENCOUNTER — Telehealth: Payer: Self-pay | Admitting: Physical Therapy

## 2018-08-23 NOTE — Telephone Encounter (Signed)
Contacted patient via video interpreter (Suzanne# [id number]) to offer appointment in clinic to resume physical therapy.   Patient did not answer home or mobile phone. A message was left via interpreter on both voicemail boxes asking patient to return call to clinic if she would like an appointment.

## 2018-09-12 DIAGNOSIS — S42201A Unspecified fracture of upper end of right humerus, initial encounter for closed fracture: Secondary | ICD-10-CM | POA: Insufficient documentation

## 2018-10-27 DIAGNOSIS — M25551 Pain in right hip: Secondary | ICD-10-CM | POA: Insufficient documentation

## 2019-05-03 DIAGNOSIS — R002 Palpitations: Secondary | ICD-10-CM | POA: Insufficient documentation

## 2020-02-01 ENCOUNTER — Ambulatory Visit: Payer: Medicare Other | Attending: Family Medicine | Admitting: Family Medicine

## 2020-02-01 ENCOUNTER — Encounter: Payer: Self-pay | Admitting: Family Medicine

## 2020-02-01 ENCOUNTER — Other Ambulatory Visit: Payer: Self-pay

## 2020-02-01 VITALS — BP 138/82 | HR 81 | Temp 97.7°F | Ht 63.0 in | Wt 125.6 lb

## 2020-02-01 DIAGNOSIS — Z8669 Personal history of other diseases of the nervous system and sense organs: Secondary | ICD-10-CM

## 2020-02-01 DIAGNOSIS — Z23 Encounter for immunization: Secondary | ICD-10-CM

## 2020-02-01 DIAGNOSIS — Z860100 Personal history of colon polyps, unspecified: Secondary | ICD-10-CM

## 2020-02-01 DIAGNOSIS — Z789 Other specified health status: Secondary | ICD-10-CM

## 2020-02-01 DIAGNOSIS — I10 Essential (primary) hypertension: Secondary | ICD-10-CM

## 2020-02-01 DIAGNOSIS — Z79899 Other long term (current) drug therapy: Secondary | ICD-10-CM

## 2020-02-01 DIAGNOSIS — E785 Hyperlipidemia, unspecified: Secondary | ICD-10-CM

## 2020-02-01 DIAGNOSIS — R002 Palpitations: Secondary | ICD-10-CM

## 2020-02-01 DIAGNOSIS — Z603 Acculturation difficulty: Secondary | ICD-10-CM

## 2020-02-01 DIAGNOSIS — Z758 Other problems related to medical facilities and other health care: Secondary | ICD-10-CM

## 2020-02-01 MED ORDER — AMLODIPINE BESYLATE 5 MG PO TABS: 5.0000 mg | ORAL_TABLET | Freq: Two times a day (BID) | ORAL | 5 refills | Status: DC

## 2020-02-01 MED ORDER — HYDROCHLOROTHIAZIDE 25 MG PO TABS: 25.0000 mg | ORAL_TABLET | Freq: Every day | ORAL | 5 refills | Status: DC

## 2020-02-01 MED ORDER — LOSARTAN POTASSIUM 100 MG PO TABS: 100.0000 mg | ORAL_TABLET | Freq: Every day | ORAL | 5 refills | Status: DC

## 2020-02-01 MED ORDER — SIMVASTATIN 10 MG PO TABS: 10.0000 mg | ORAL_TABLET | Freq: Every day | ORAL | 5 refills | Status: DC

## 2020-02-01 NOTE — Progress Notes (Signed)
Subjective:  Patient ID: Jo Brown, female    DOB: 02/24/1934  Age: 85 y.o. MRN: [id number]  CC: Establish Care   HPI Jo Brown, 86 year old female who presents to establish care.  She has a past history significant for hypertension, glaucoma and hyperlipidemia.  She does have regular follow-up with an ophthalmologist, Dr. Logan Bores.  She will need refills of her current medications for her blood pressure as well as for hyperlipidemia.  Overall she reports that she feels well.  She denies headaches or dizziness related to her blood pressure.  She has had migraine headaches in the past but these have not occurred recently.  She has felt as if she has occasional episodes of increased heart rate/palpitations.  She denies any issues with chest pain, no shortness of breath or cough, no swelling in the feet or ankles.  She has had a colonoscopy about 5 years ago while living in [place] and she reports removal of polyps.  She denies any blood in the stool or black stools.  No issues with abdominal pain.  She has noticed no muscle or joint pain with the use of cholesterol medication.  Overall she feels well.  Past Medical History:  Diagnosis Date   Hypertension     Past Surgical History:  Procedure Laterality Date   EYE SURGERY      Family History  Problem Relation Age of Onset   Healthy Neg Hx     Social History   Tobacco Use   Smoking status: Never Smoker   Smokeless tobacco: Never Used  Substance Use Topics   Alcohol use: Never    ROS Review of Systems  Constitutional: Negative for chills and fever.  HENT: Negative for sore throat and trouble swallowing.   Eyes: Positive for visual disturbance. Negative for photophobia.  Respiratory: Negative for cough.   Cardiovascular: Positive for palpitations. Negative for chest pain.  Gastrointestinal: Negative for abdominal pain, blood in stool, constipation, diarrhea and nausea.  Endocrine: Negative for polydipsia, polyphagia and  polyuria.  Genitourinary: Negative for dysuria and frequency.  Musculoskeletal: Negative for arthralgias and back pain.  Skin: Negative for rash and wound.  Neurological: Negative for dizziness and headaches.  Hematological: Negative for adenopathy. Does not bruise/bleed easily.  Psychiatric/Behavioral: Negative for suicidal ideas. The patient is not nervous/anxious.     Objective:   Today's Vitals: BP (!) 155/77 (BP Location: Right Arm, Patient Position: Sitting)    Pulse 81    Temp 97.7 F (36.5 C)    Ht 5\' 3"  (1.6 m)    Wt 125 lb 9.6 oz (57 kg)    SpO2 100%    BMI 22.25 kg/m   Repeat:  BP 138/82    Pulse 81    Temp 97.7 F (36.5 C)    Ht 5\' 3"  (1.6 m)    Wt 125 lb 9.6 oz (57 kg)    SpO2 100%    BMI 22.25 kg/m   Physical Exam Vitals and nursing note reviewed.  Constitutional:      General: She is not in acute distress.    Appearance: Normal appearance.  Neck:     Vascular: No carotid bruit.  Cardiovascular:     Rate and Rhythm: Normal rate and regular rhythm.  Pulmonary:     Effort: Pulmonary effort is normal.     Breath sounds: Normal breath sounds.  Abdominal:     Palpations: Abdomen is soft.     Tenderness: There is no abdominal tenderness. There  is no right CVA tenderness, left CVA tenderness, guarding or rebound.  Musculoskeletal:        General: No tenderness.     Cervical back: Normal range of motion and neck supple.     Right lower leg: No edema.     Left lower leg: No edema.  Lymphadenopathy:     Cervical: No cervical adenopathy.  Skin:    General: Skin is warm and dry.  Neurological:     General: No focal deficit present.     Mental Status: She is alert and oriented to person, place, and time.  Psychiatric:        Mood and Affect: Mood normal.     Assessment & Plan:  1. Essential hypertension She has been provided with refills of her current medications including amlodipine 5 mg twice daily, losartan and hydrochlorothiazide.  She will have basic  metabolic panel to check electrolytes/renal function in follow-up of long-term use of medication.  Repeat blood pressure was within normal while here today's visit. - Basic Metabolic Panel  2. Hyperlipidemia, unspecified hyperlipidemia type She reports issues with hyperlipidemia for which she has been on simvastatin 10 mg.  She will have hepatic function panel in follow-up of long-term use of medications. - Hepatic Function Panel  3. Palpitations Patient reports occasional symptoms of palpitations.  Will check BMP to look for electrolyte abnormality, CBC to look for anemia and T4/TSH to look for thyroid dysfunction as a possible cause of patient's occasional sensation of palpitations. - Basic Metabolic Panel - CBC with Differential - T4 AND TSH  4. Hx of migraine headaches She reports a history of migraines but has not had any recent issues.  5. Encounter for long-term current use of medication Patient will have basic metabolic panel function and hepatic function panel in follow-up for long-term use of medications for the treatment of hypertension and hyperlipidemia as these medications can affect electrolytes, renal function and liver enzymes - Basic Metabolic Panel - Hepatic Function Panel  6. History of colon polyps Will check CBC as patient reports history of removal of colon polyps and patient additionally with complaint of palpitations - CBC with Differential  7. Need for immunization against influenza Influenza immunization given at today's visit along with educational information - Flu Vaccine QUAD 36+ mos IM  8. Language barrier Video interpretation system used at today's visit to help with language barrier  Outpatient Encounter Medications as of 02/01/2020  Medication Sig   amLODipine (NORVASC) 5 MG tablet Take 5 mg by mouth 2 (two) times daily.    hydrochlorothiazide (HYDRODIURIL) 25 MG tablet Take 25 mg by mouth daily.   latanoprost (XALATAN) 0.005 % ophthalmic  solution Place 1 drop into both eyes at bedtime.   losartan (COZAAR) 100 MG tablet Take 100 mg by mouth daily.   simvastatin (ZOCOR) 10 MG tablet Take 10 mg by mouth at bedtime.   morphine (MSIR) 15 MG tablet Take 0.5 tablets (7.5 mg total) by mouth every 4 (four) hours as needed for severe pain. (Patient not taking: Reported on 05/24/2018)   naproxen (NAPROSYN) 500 MG tablet Take 500 mg by mouth 2 (two) times daily with a meal. (Patient not taking: Reported on 02/01/2020)   No facility-administered encounter medications on file as of 02/01/2020.      Follow-up: Return in about 4 months (around 06/03/2020) for chronic issues; 3-4 week f/u with Luke/CPP for BP recheck.   Cain Saupe MD

## 2020-02-02 LAB — BASIC METABOLIC PANEL WITH GFR
BUN/Creatinine Ratio: 19 (ref 12–28)
BUN: 19 mg/dL (ref 8–27)
CO2: 23 mmol/L (ref 20–29)
Calcium: 9.2 mg/dL (ref 8.7–10.3)
Chloride: 104 mmol/L (ref 96–106)
Creatinine, Ser: 0.98 mg/dL (ref 0.57–1.00)
GFR calc Af Amer: 61 mL/min/1.73
GFR calc non Af Amer: 53 mL/min/1.73 — ABNORMAL LOW
Glucose: 90 mg/dL (ref 65–99)
Potassium: 4.3 mmol/L (ref 3.5–5.2)
Sodium: 141 mmol/L (ref 134–144)

## 2020-02-02 LAB — CBC WITH DIFFERENTIAL/PLATELET
Basophils Absolute: 0 x10E3/uL (ref 0.0–0.2)
Basos: 0 %
EOS (ABSOLUTE): 0.3 x10E3/uL (ref 0.0–0.4)
Eos: 4 %
Hematocrit: 37.5 % (ref 34.0–46.6)
Hemoglobin: 12.4 g/dL (ref 11.1–15.9)
Immature Grans (Abs): 0 x10E3/uL (ref 0.0–0.1)
Immature Granulocytes: 0 %
Lymphocytes Absolute: 2.3 x10E3/uL (ref 0.7–3.1)
Lymphs: 36 %
MCH: 31.5 pg (ref 26.6–33.0)
MCHC: 33.1 g/dL (ref 31.5–35.7)
MCV: 95 fL (ref 79–97)
Monocytes Absolute: 0.4 x10E3/uL (ref 0.1–0.9)
Monocytes: 7 %
Neutrophils Absolute: 3.3 x10E3/uL (ref 1.4–7.0)
Neutrophils: 53 %
Platelets: 277 x10E3/uL (ref 150–450)
RBC: 3.94 x10E6/uL (ref 3.77–5.28)
RDW: 12.5 % (ref 11.7–15.4)
WBC: 6.3 x10E3/uL (ref 3.4–10.8)

## 2020-02-02 LAB — HEPATIC FUNCTION PANEL
ALT: 15 IU/L (ref 0–32)
AST: 22 IU/L (ref 0–40)
Albumin: 4.6 g/dL (ref 3.6–4.6)
Alkaline Phosphatase: 91 IU/L (ref 44–121)
Bilirubin Total: 0.3 mg/dL (ref 0.0–1.2)
Bilirubin, Direct: 0.11 mg/dL (ref 0.00–0.40)
Total Protein: 6.9 g/dL (ref 6.0–8.5)

## 2020-02-02 LAB — T4 AND TSH
T4, Total: 10.9 ug/dL (ref 4.5–12.0)
TSH: 0.925 u[IU]/mL (ref 0.450–4.500)

## 2020-02-07 ENCOUNTER — Telehealth: Payer: Self-pay | Admitting: Family Medicine

## 2020-02-07 ENCOUNTER — Telehealth: Payer: Self-pay

## 2020-02-07 DIAGNOSIS — I10 Essential (primary) hypertension: Secondary | ICD-10-CM

## 2020-02-07 DIAGNOSIS — E785 Hyperlipidemia, unspecified: Secondary | ICD-10-CM

## 2020-02-07 MED ORDER — LOSARTAN POTASSIUM 100 MG PO TABS: 100.0000 mg | ORAL_TABLET | Freq: Every day | ORAL | 5 refills | Status: DC

## 2020-02-07 MED ORDER — AMLODIPINE BESYLATE 5 MG PO TABS: 5.0000 mg | ORAL_TABLET | Freq: Two times a day (BID) | ORAL | 5 refills | Status: DC

## 2020-02-07 MED ORDER — SIMVASTATIN 10 MG PO TABS: 10.0000 mg | ORAL_TABLET | Freq: Every day | ORAL | 5 refills | Status: DC

## 2020-02-07 MED ORDER — HYDROCHLOROTHIAZIDE 25 MG PO TABS: 25.0000 mg | ORAL_TABLET | Freq: Every day | ORAL | 5 refills | Status: DC

## 2020-02-07 NOTE — Telephone Encounter (Signed)
Rx sent 

## 2020-02-07 NOTE — Telephone Encounter (Signed)
[employer] interpre[employer] used to contact patient to advise of results. No ans lvm.. Letter sent of normal lab results

## 2020-02-07 NOTE — Telephone Encounter (Signed)
-----   Message from Cain Saupe, MD sent at 02/06/2020  9:08 AM EDT ----- Normal electrolytes with exception of mild decrease in GFR. Normal liver enzymes. Normal complete blood count. Normal thyroid blood work.

## 2020-02-07 NOTE — Telephone Encounter (Signed)
Patient medication that were sent of 10/15 to [facility] did not go through. Please re send them again.

## 2020-03-03 ENCOUNTER — Encounter: Payer: Self-pay | Admitting: Pharmacist

## 2020-03-03 ENCOUNTER — Other Ambulatory Visit: Payer: Self-pay

## 2020-03-03 ENCOUNTER — Ambulatory Visit: Payer: Medicare Other | Attending: Family Medicine | Admitting: Pharmacist

## 2020-03-03 VITALS — BP 138/78

## 2020-03-03 DIAGNOSIS — I10 Essential (primary) hypertension: Secondary | ICD-10-CM

## 2020-03-03 NOTE — Progress Notes (Signed)
   S:    PCP: Dr. Jillyn Hidden Patient arrives in good spirits. Presents to the clinic for hypertension evaluation, counseling, and management.  Patient was referred and last seen by Primary Care Provider on 02/01/2020. BP at that visit was elevated initially but good on repeat. No medication changes were made  Medication adherence reported.  Current BP Medications include:  Amlodipine 5 mg daily, HCTZ 25 mg daily, and losartan 100 mg daily   Dietary habits include: compliant with a low-sodium diet; denies drinking excess caffeine  Exercise habits include: works out on "the machine" daily  Family / Social history:  - FHx: no pertinent positives  - Alcohol: none  - Tobacco: never smoker    O:  Vitals:   03/03/20 1048  BP: 138/78    Home BP readings:  - Lists readings of 120s-130s/70s-80s - Took this morning and was 117/72  Last 3 Office BP readings: BP Readings from Last 3 Encounters:  03/03/20 138/78  02/01/20 138/82  03/21/18 (!) 145/81    BMET    Component Value Date/Time   NA 141 02/01/2020 1515   K 4.3 02/01/2020 1515   CL 104 02/01/2020 1515   CO2 23 02/01/2020 1515   GLUCOSE 90 02/01/2020 1515   BUN 19 02/01/2020 1515   CREATININE 0.98 02/01/2020 1515   CALCIUM 9.2 02/01/2020 1515   GFRNONAA 53 (L) 02/01/2020 1515   GFRAA 61 02/01/2020 1515    Renal function: CrCl cannot be calculated (Patient's most recent lab result is older than the maximum 21 days allowed.).  Clinical ASCVD: No  The ASCVD Risk score Denman George DC Jr., et al., 2013) failed to calculate for the following reasons:   The 2013 ASCVD risk score is only valid for ages 71 to 22   A/P: Hypertension longstanding on current medications. SBP Goal = < 130 mmHg. Medication adherence reported. Bps at home look good.   -Continued current regimen. -Counseled on lifestyle modifications for blood pressure control including reduced dietary sodium, increased exercise, adequate sleep.  Results reviewed and  written information provided.   Total time in face-to-face counseling 15 minutes.   F/U Clinic Visit w/ PCP.   Butch Penny, PharmD, CPP Clinical Pharmacist [facility] [phone]

## 2020-06-02 ENCOUNTER — Encounter: Payer: Self-pay | Admitting: Nurse Practitioner

## 2020-06-02 ENCOUNTER — Ambulatory Visit: Payer: Medicare Other | Attending: Nurse Practitioner | Admitting: Nurse Practitioner

## 2020-06-02 ENCOUNTER — Other Ambulatory Visit: Payer: Self-pay

## 2020-06-02 ENCOUNTER — Ambulatory Visit: Payer: Medicare Other | Admitting: Family Medicine

## 2020-06-02 VITALS — BP 125/68 | HR 77 | Temp 98.7°F | Ht 63.0 in | Wt 126.0 lb

## 2020-06-02 DIAGNOSIS — Z79899 Other long term (current) drug therapy: Secondary | ICD-10-CM | POA: Insufficient documentation

## 2020-06-02 DIAGNOSIS — H409 Unspecified glaucoma: Secondary | ICD-10-CM

## 2020-06-02 DIAGNOSIS — E785 Hyperlipidemia, unspecified: Secondary | ICD-10-CM | POA: Insufficient documentation

## 2020-06-02 DIAGNOSIS — M25552 Pain in left hip: Secondary | ICD-10-CM | POA: Diagnosis not present

## 2020-06-02 DIAGNOSIS — E559 Vitamin D deficiency, unspecified: Secondary | ICD-10-CM | POA: Insufficient documentation

## 2020-06-02 DIAGNOSIS — I10 Essential (primary) hypertension: Secondary | ICD-10-CM | POA: Insufficient documentation

## 2020-06-02 MED ORDER — SIMVASTATIN 10 MG PO TABS: 10.0000 mg | ORAL_TABLET | Freq: Every day | ORAL | 1 refills | Status: DC

## 2020-06-02 MED ORDER — AMLODIPINE BESYLATE 5 MG PO TABS: 5.0000 mg | ORAL_TABLET | Freq: Two times a day (BID) | ORAL | 1 refills | Status: DC

## 2020-06-02 MED ORDER — HYDROCHLOROTHIAZIDE 25 MG PO TABS: 25.0000 mg | ORAL_TABLET | Freq: Every day | ORAL | 0 refills | Status: DC

## 2020-06-02 MED ORDER — LOSARTAN POTASSIUM 100 MG PO TABS: 100.0000 mg | ORAL_TABLET | Freq: Every day | ORAL | 0 refills | Status: DC

## 2020-06-02 MED ORDER — DICLOFENAC SODIUM 1 % EX GEL: 4.0000 g | Freq: Four times a day (QID) | CUTANEOUS | 0 refills | Status: DC

## 2020-06-02 MED ORDER — LATANOPROST 0.005 % OP SOLN: 1.0000 [drp] | Freq: Every day | OPHTHALMIC | 0 refills | Status: DC

## 2020-06-02 NOTE — Progress Notes (Signed)
Assessment & Plan:  Jo Brown was seen today for hypertension.  Diagnoses and all orders for this visit:  Essential hypertension -     CMP14+EGFR -     amLODipine (NORVASC) 5 MG tablet; Take 1 tablet (5 mg total) by mouth 2 (two) times daily. -     hydrochlorothiazide (HYDRODIURIL) 25 MG tablet; Take 1 tablet (25 mg total) by mouth daily. -     losartan (COZAAR) 100 MG tablet; Take 1 tablet (100 mg total) by mouth daily. Continue all antihypertensives as prescribed.  Remember to bring in your blood pressure log with you for your follow up appointment.  DASH/Mediterranean Diets are healthier choices for HTN.    Left hip pain -     diclofenac Sodium (VOLTAREN) 1 % GEL; Apply 4 g topically 4 (four) times daily. -     VITAMIN D 25 Hydroxy (Vit-D Deficiency, Fractures)  May alternate with heat and ice application for pain relief. May also alternate with acetaminophen as prescribed pain relief. Other alternatives include massage, acupuncture and water aerobics.  You must stay active and avoid a sedentary lifestyle.  Glaucoma of both eyes, unspecified glaucoma type -     Ambulatory referral to Ophthalmology -     latanoprost (XALATAN) 0.005 % ophthalmic solution; Place 1 drop into both eyes at bedtime.  Vitamin D deficiency disease -     VITAMIN D 25 Hydroxy (Vit-D Deficiency, Fractures)  Hyperlipidemia, unspecified hyperlipidemia type -     simvastatin (ZOCOR) 10 MG tablet; Take 1 tablet (10 mg total) by mouth at bedtime. INSTRUCTIONS: Work on a low fat, heart healthy diet and participate in regular aerobic exercise program by working out at least 150 minutes per week; 5 days a week-30 minutes per day. Avoid red meat/beef/steak,  fried foods. junk foods, sodas, sugary drinks, unhealthy snacking, alcohol and smoking.  Drink at least 80 oz of water per day and monitor your carbohydrate intake daily.      Patient has been counseled on age-appropriate routine health concerns for screening and  prevention. These are reviewed and up-to-date. Referrals have been placed accordingly. Immunizations are up-to-date or declined.    Subjective:   Chief Complaint  Patient presents with  . Hypertension    Patient is here for hypertension and re-establish care with PCP. Patient stated she is having pain on the bottom of her buttock.    HPI Jo Brown 87 y.o. female presents to office today to establish care with me as a provider today. VRI was used to communicate directly with patient for the entire encounter including providing detailed patient instructions.   Left Hip Pain/OA She walks on the treadmill daily. Endorses left posterior hip pain with onset 2 months ago.  Treadmill does have an incline to it. I have asked her to lower it to a flat level to see if this will help with her hip pain.   Essential Hypertension Blood pressure at home with average readings: 110-120/60-70s.  She does have a blood pressure log with her today. Associated symptoms include dry mouth. Denies chest pain, shortness of breath, palpitations, lightheadedness, dizziness, headaches or BLE edema.  BP Readings from Last 3 Encounters:  06/02/20 125/68  03/03/20 138/78  02/01/20 138/82    EYE problem She is requesting a referral to a new ophthalmologist due to the  distance of her current ophthalmologist from her home.  She has a history of bilateral cataracts with bilaterally extractions in 2015.  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  HENT: Negative for nosebleeds.        Dry mouth  Eyes: Positive for blurred vision. Negative for double vision and photophobia.       See HPI  Respiratory: Negative.  Negative for cough and shortness of  breath.   Cardiovascular: Negative.  Negative for chest pain, palpitations and leg swelling.  Gastrointestinal: Negative.  Negative for heartburn, nausea and vomiting.  Musculoskeletal: Positive for joint pain. Negative for myalgias.  Neurological: Negative.  Negative for dizziness, focal weakness, seizures and headaches.  Psychiatric/Behavioral: Negative.  Negative for suicidal ideas.    Past Medical History:  Diagnosis Date  . Hypertension     Past Surgical History:  Procedure Laterality Date  . EYE SURGERY    . removal of colon polyps      Family History  Problem Relation Age of Onset  . Healthy Neg Hx     Social History Reviewed with no changes to be made today.   Outpatient Medications Prior to Visit  Medication Sig Dispense Refill  . amLODipine (NORVASC) 5 MG tablet Take 1 tablet (5 mg total) by mouth 2 (two) times daily. 60 tablet 5  . hydrochlorothiazide (HYDRODIURIL) 25 MG tablet Take 1 tablet (25 mg total) by mouth daily. 30 tablet 5  . latanoprost (XALATAN) 0.005 % ophthalmic solution Place 1 drop into both eyes at bedtime.    Marland Kitchen losartan (COZAAR) 100 MG tablet Take 1 tablet (100 mg total) by mouth daily. 30 tablet 5  . simvastatin (ZOCOR) 10 MG tablet Take 1 tablet (10 mg total) by mouth at bedtime. 30 tablet 5   No facility-administered medications prior to visit.    No Known Allergies     Objective:    BP 125/68 (BP Location: Left Arm, Patient Position: Sitting, Cuff Size: Normal)   Pulse 77   Temp 98.7 F (37.1 C) (Oral)   Ht $R'5\' 3"'OE$  (1.6 m)   Wt 126 lb (57.2 kg)   SpO2 100%   BMI 22.32 kg/m  Wt Readings from Last 3 Encounters:  06/02/20 126 lb (57.2 kg)  02/01/20 125 lb 9.6 oz (57 kg)  03/21/18 130 lb (59 kg)    Physical Exam Vitals and nursing note reviewed.  Constitutional:      Appearance: She is well-developed and well-nourished.  HENT:     Head: Normocephalic and atraumatic.     Mouth/Throat:     Mouth: Mucous membranes are moist.   Eyes:     Extraocular Movements: EOM normal.  Cardiovascular:     Rate and Rhythm: Normal rate and regular rhythm.     Pulses: Intact distal pulses.     Heart sounds: Normal heart sounds. No murmur heard. No friction rub. No gallop.   Pulmonary:     Effort: Pulmonary effort is normal. No tachypnea or respiratory distress.     Breath sounds: Normal breath sounds. No decreased breath sounds, wheezing, rhonchi or rales.  Chest:     Chest wall: No tenderness.  Abdominal:     General: Bowel sounds are normal.     Palpations: Abdomen is soft.  Musculoskeletal:        General: Tenderness present. No swelling, deformity, signs of injury or edema. Normal range of motion.     Cervical back:  Normal range of motion.     Left hip: Tenderness present. No deformity, lacerations, bony tenderness or crepitus. Normal range of motion. Normal strength.     Right lower leg: No edema.     Left lower leg: No edema.       Legs:  Skin:    General: Skin is warm and dry.  Neurological:     Mental Status: She is alert and oriented to person, place, and time.     Coordination: Coordination normal.  Psychiatric:        Mood and Affect: Mood and affect normal.        Behavior: Behavior normal. Behavior is cooperative.        Thought Content: Thought content normal.        Judgment: Judgment normal.          Patient has been counseled extensively about nutrition and exercise as well as the importance of adherence with medications and regular follow-up. The patient was given clear instructions to go to ER or return to medical center if symptoms don't improve, worsen or new problems develop. The patient verbalized understanding.   Follow-up: Return for LEFT HIP PAIN TELEVISIT 3 weeks.   Gildardo Pounds, FNP-BC [facility] [address] [place], [place]   06/02/2020, 12:10 PM

## 2020-06-03 ENCOUNTER — Other Ambulatory Visit: Payer: Self-pay | Admitting: Nurse Practitioner

## 2020-06-03 ENCOUNTER — Telehealth: Payer: Self-pay

## 2020-06-03 DIAGNOSIS — H409 Unspecified glaucoma: Secondary | ICD-10-CM

## 2020-06-03 LAB — CMP14+EGFR
ALT: 11 IU/L (ref 0–32)
AST: 17 IU/L (ref 0–40)
Albumin/Globulin Ratio: 1.8 (ref 1.2–2.2)
Albumin: 4.5 g/dL (ref 3.6–4.6)
Alkaline Phosphatase: 94 IU/L (ref 44–121)
BUN/Creatinine Ratio: 24 (ref 12–28)
BUN: 23 mg/dL (ref 8–27)
Bilirubin Total: 0.4 mg/dL (ref 0.0–1.2)
CO2: 22 mmol/L (ref 20–29)
Calcium: 9.4 mg/dL (ref 8.7–10.3)
Chloride: 104 mmol/L (ref 96–106)
Creatinine, Ser: 0.97 mg/dL (ref 0.57–1.00)
GFR calc Af Amer: 62 mL/min/{1.73_m2} (ref >59)
GFR calc non Af Amer: 54 mL/min/{1.73_m2} — ABNORMAL LOW (ref >59)
Globulin, Total: 2.5 g/dL (ref 1.5–4.5)
Glucose: 98 mg/dL (ref 65–99)
Potassium: 4.6 mmol/L (ref 3.5–5.2)
Sodium: 140 mmol/L (ref 134–144)
Total Protein: 7 g/dL (ref 6.0–8.5)

## 2020-06-03 LAB — VITAMIN D 25 HYDROXY (VIT D DEFICIENCY, FRACTURES): Vit D, 25-Hydroxy: 20.3 ng/mL — ABNORMAL LOW (ref 30.0–100.0)

## 2020-06-03 NOTE — Telephone Encounter (Signed)
PA FOR DICLOFENAC GEL APPROVED UNTIL 06/03/21

## 2020-06-05 ENCOUNTER — Other Ambulatory Visit: Payer: Self-pay | Admitting: Nurse Practitioner

## 2020-06-05 DIAGNOSIS — H409 Unspecified glaucoma: Secondary | ICD-10-CM

## 2020-06-23 ENCOUNTER — Ambulatory Visit: Payer: Medicare Other | Attending: Nurse Practitioner | Admitting: Nurse Practitioner

## 2020-06-23 ENCOUNTER — Other Ambulatory Visit: Payer: Self-pay

## 2020-06-23 NOTE — Progress Notes (Signed)
NO ANSWER.  CALLED TWICE WITH INTERPRETER

## 2020-07-07 ENCOUNTER — Other Ambulatory Visit: Payer: Self-pay | Admitting: Nurse Practitioner

## 2020-07-07 DIAGNOSIS — H409 Unspecified glaucoma: Secondary | ICD-10-CM

## 2020-07-31 ENCOUNTER — Other Ambulatory Visit: Payer: Self-pay | Admitting: Nurse Practitioner

## 2020-07-31 DIAGNOSIS — M25552 Pain in left hip: Secondary | ICD-10-CM

## 2020-08-02 ENCOUNTER — Other Ambulatory Visit: Payer: Self-pay | Admitting: Nurse Practitioner

## 2020-08-02 DIAGNOSIS — M25552 Pain in left hip: Secondary | ICD-10-CM

## 2020-08-04 MED ORDER — DICLOFENAC SODIUM 1 % EX GEL: 2.0000 g | Freq: Four times a day (QID) | CUTANEOUS | 1 refills | Status: DC

## 2020-10-31 ENCOUNTER — Other Ambulatory Visit: Payer: Self-pay | Admitting: Nurse Practitioner

## 2020-10-31 DIAGNOSIS — I10 Essential (primary) hypertension: Secondary | ICD-10-CM

## 2020-11-01 NOTE — Telephone Encounter (Signed)
Requested Prescriptions  Pending Prescriptions Disp Refills  . hydrochlorothiazide (HYDRODIURIL) 25 MG tablet [Pharmacy Med Name: HYDROCHLOROTHIAZIDE 25MG  TABLETS] 90 tablet 0    Sig: TAKE 1 TABLET(25 MG) BY MOUTH DAILY     Cardiovascular: Diuretics - Thiazide Passed - 10/31/2020  8:55 PM      Passed - Ca in normal range and within 360 days    Calcium  Date Value Ref Range Status  06/02/2020 9.4 8.7 - 10.3 mg/dL Final         Passed - Cr in normal range and within 360 days    Creatinine, Ser  Date Value Ref Range Status  06/02/2020 0.97 0.57 - 1.00 mg/dL Final         Passed - K in normal range and within 360 days    Potassium  Date Value Ref Range Status  06/02/2020 4.6 3.5 - 5.2 mmol/L Final         Passed - Na in normal range and within 360 days    Sodium  Date Value Ref Range Status  06/02/2020 140 134 - 144 mmol/L Final         Passed - Last BP in normal range    BP Readings from Last 1 Encounters:  06/02/20 125/68         Passed - Valid encounter within last 6 months    Recent Outpatient Visits          5 months ago Essential hypertension   [facility] [place], [place], NP   8 months ago Essential hypertension   [facility] [place], [place], RPH-CPP   9 months ago Essential hypertension   [facility] [place], [place], MD

## 2020-12-09 DIAGNOSIS — M545 Low back pain, unspecified: Secondary | ICD-10-CM | POA: Insufficient documentation

## 2020-12-15 ENCOUNTER — Encounter: Payer: Self-pay | Admitting: Nurse Practitioner

## 2020-12-15 ENCOUNTER — Ambulatory Visit: Payer: Medicare Other | Attending: Nurse Practitioner | Admitting: Nurse Practitioner

## 2020-12-15 ENCOUNTER — Other Ambulatory Visit: Payer: Self-pay

## 2020-12-15 VITALS — BP 151/78 | HR 79 | Wt 125.1 lb

## 2020-12-15 DIAGNOSIS — Z79899 Other long term (current) drug therapy: Secondary | ICD-10-CM | POA: Insufficient documentation

## 2020-12-15 DIAGNOSIS — R002 Palpitations: Secondary | ICD-10-CM | POA: Insufficient documentation

## 2020-12-15 DIAGNOSIS — E785 Hyperlipidemia, unspecified: Secondary | ICD-10-CM

## 2020-12-15 DIAGNOSIS — H409 Unspecified glaucoma: Secondary | ICD-10-CM | POA: Diagnosis not present

## 2020-12-15 DIAGNOSIS — B351 Tinea unguium: Secondary | ICD-10-CM | POA: Diagnosis not present

## 2020-12-15 DIAGNOSIS — M25552 Pain in left hip: Secondary | ICD-10-CM | POA: Insufficient documentation

## 2020-12-15 DIAGNOSIS — Z791 Long term (current) use of non-steroidal anti-inflammatories (NSAID): Secondary | ICD-10-CM | POA: Insufficient documentation

## 2020-12-15 DIAGNOSIS — I10 Essential (primary) hypertension: Secondary | ICD-10-CM | POA: Diagnosis present

## 2020-12-15 MED ORDER — SIMVASTATIN 10 MG PO TABS: 10.0000 mg | ORAL_TABLET | Freq: Every day | ORAL | 1 refills | Status: DC

## 2020-12-15 MED ORDER — LATANOPROST 0.005 % OP SOLN: OPHTHALMIC | 1 refills | Status: DC

## 2020-12-15 MED ORDER — LOSARTAN POTASSIUM-HCTZ 100-25 MG PO TABS: 1.0000 | ORAL_TABLET | Freq: Every day | ORAL | 1 refills | Status: DC

## 2020-12-15 MED ORDER — DICLOFENAC SODIUM 1 % EX GEL: 2.0000 g | Freq: Four times a day (QID) | CUTANEOUS | 1 refills | Status: DC

## 2020-12-15 NOTE — Progress Notes (Signed)
Assessment & Plan:  Jo Brown was seen today for palpitations.  Diagnoses and all orders for this visit:  Essential hypertension -     losartan-hydrochlorothiazide (HYZAAR) 100-25 MG tablet; Take 1 tablet by mouth daily. -     CMP14+EGFR Continue all antihypertensives as prescribed.  Remember to bring in your blood pressure log with you for your follow up appointment.  DASH/Mediterranean Diets are healthier choices for HTN.    Onychomycosis -     Ambulatory referral to Podiatry  Hyperlipidemia, unspecified hyperlipidemia type -     simvastatin (ZOCOR) 10 MG tablet; Take 1 tablet (10 mg total) by mouth at bedtime. INSTRUCTIONS: Work on a low fat, heart healthy diet and participate in regular aerobic exercise program by working out at least 150 minutes per week; 5 days a week-30 minutes per day. Avoid red meat/beef/steak,  fried foods. junk foods, sodas, sugary drinks, unhealthy snacking, alcohol and smoking.  Drink at least 80 oz of water per day and monitor your carbohydrate intake daily.    Left hip pain -     diclofenac Sodium (VOLTAREN) 1 % GEL; Apply 2 g topically 4 (four) times daily.  Glaucoma of both eyes, unspecified glaucoma type -     latanoprost (XALATAN) 0.005 % ophthalmic solution; INSTILL 1 DROP IN BOTH EYES AT BEDTIME  Heart palpitations -     Ambulatory referral to Cardiology -     Thyroid Panel With TSH   Patient has been counseled on age-appropriate routine health concerns for screening and prevention. These are reviewed and up-to-date. Referrals have been placed accordingly. Immunizations are up-to-date or declined.    Subjective:   Chief Complaint  Patient presents with   Palpitations   HPI Jo Brown 87 y.o. female presents to office today for palpitations and headaches.  She has a past medical history of Hypertension.    Palpitations: Patient complains of rapid heart beat mostly occurring in the early morning hours. Onset a few years ago. She has not  seen a cardiology for this.  The symptoms are of mild severity lasting seconds to minutes per episode. Cardiac risk factors include: advanced age (older than 66 for men, 46 for women) and hypertension. Aggravating factors: none. Relieving factors: spontaneous. Associated signs and symptoms: none    HTN Reports home readings 120-130/80s. She endorses headaches in the back of her neck and occipital region mostly occurring at night. Pain lasts for a few minutes. She does not notice the headaches once she falls back asleep. Onset of headaches was over a year ago. She is only taking amlodpine $RemoveBeforeDE'5mg'tUBfafUYUmpeyvZ$  instead of $RemoveBe'10mg'lrUMBGrKz$  as well as losartan 100 mg and HCTZ 25 mg daily. Will stop amlodipine for now and combine losartan and HCTZ (HYZAAR). Denies chest pain, shortness of breath, or BLE edema.   BP Readings from Last 3 Encounters:  12/15/20 (!) 151/78  06/02/20 125/68  03/03/20 138/78     TOE PROBLEM She has pain in the left big toe as well as severe onychomycosis.      Review of Systems  Constitutional:  Negative for fever, malaise/fatigue and weight loss.  HENT: Negative.  Negative for nosebleeds.   Eyes: Negative.  Negative for blurred vision, double vision and photophobia.  Respiratory: Negative.  Negative for cough and shortness of breath.   Cardiovascular:  Positive for palpitations. Negative for chest pain and leg swelling.  Gastrointestinal: Negative.  Negative for heartburn, nausea and vomiting.  Musculoskeletal:  Positive for joint pain. Negative for myalgias.  Skin:        SEE HPI  Neurological:  Positive for dizziness and headaches. Negative for focal weakness and seizures.  Psychiatric/Behavioral: Negative.  Negative for suicidal ideas.    Past Medical History:  Diagnosis Date   Hypertension     Past Surgical History:  Procedure Laterality Date   EYE SURGERY     removal of colon polyps      Family History  Problem Relation Age of Onset   Healthy Neg Hx     Social History  Reviewed with no changes to be made today.   Outpatient Medications Prior to Visit  Medication Sig Dispense Refill   diclofenac Sodium (VOLTAREN) 1 % GEL Apply 2 g topically 4 (four) times daily. 400 g 1   hydrochlorothiazide (HYDRODIURIL) 25 MG tablet TAKE 1 TABLET(25 MG) BY MOUTH DAILY 90 tablet 0   latanoprost (XALATAN) 0.005 % ophthalmic solution INSTILL 1 DROP IN BOTH EYES AT BEDTIME 7.5 mL 0   simvastatin (ZOCOR) 10 MG tablet Take 1 tablet (10 mg total) by mouth at bedtime. 90 tablet 1   amLODipine (NORVASC) 5 MG tablet Take 1 tablet (5 mg total) by mouth 2 (two) times daily. 180 tablet 1   losartan (COZAAR) 100 MG tablet Take 1 tablet (100 mg total) by mouth daily. 90 tablet 0   No facility-administered medications prior to visit.    No Known Allergies     Objective:    BP (!) 151/78   Pulse 79   Wt 125 lb 2 oz (56.8 kg)   SpO2 99%   BMI 22.16 kg/m  Wt Readings from Last 3 Encounters:  12/15/20 125 lb 2 oz (56.8 kg)  06/02/20 126 lb (57.2 kg)  02/01/20 125 lb 9.6 oz (57 kg)    Physical Exam Vitals and nursing note reviewed.  Constitutional:      Appearance: She is well-developed.  HENT:     Head: Normocephalic and atraumatic.  Cardiovascular:     Rate and Rhythm: Normal rate and regular rhythm.     Heart sounds: Normal heart sounds. No murmur heard.   No friction rub. No gallop.  Pulmonary:     Effort: Pulmonary effort is normal. No tachypnea or respiratory distress.     Breath sounds: Normal breath sounds. No decreased breath sounds, wheezing, rhonchi or rales.  Chest:     Chest wall: No tenderness.  Abdominal:     General: Bowel sounds are normal.     Palpations: Abdomen is soft.  Musculoskeletal:        General: Normal range of motion.     Cervical back: Normal range of motion.     Left foot: Deformity and bunion present.  Feet:     Left foot:     Skin integrity: No skin breakdown.     Toenail Condition: Left toenails are abnormally thick. Fungal  disease present. Skin:    General: Skin is warm and dry.  Neurological:     Mental Status: She is alert and oriented to person, place, and time.     Coordination: Coordination normal.  Psychiatric:        Behavior: Behavior normal. Behavior is cooperative.        Thought Content: Thought content normal.        Judgment: Judgment normal.         Patient has been counseled extensively about nutrition and exercise as well as the importance of adherence with medications and regular follow-up. The patient was  given clear instructions to go to ER or return to medical center if symptoms don't improve, worsen or new problems develop. The patient verbalized understanding.   Follow-up: Return for double book 1:50 next wednesday .   Gildardo Pounds, FNP-BC [facility] and [place], [place]   12/15/2020, 8:13 PM

## 2020-12-15 NOTE — Progress Notes (Signed)
Jo Brown [id number] Palpitation  x several months

## 2020-12-16 LAB — THYROID PANEL WITH TSH
Free Thyroxine Index: 2.8 (ref 1.2–4.9)
T3 Uptake Ratio: 29 % (ref 24–39)
T4, Total: 9.8 ug/dL (ref 4.5–12.0)
TSH: 1.62 u[IU]/mL (ref 0.450–4.500)

## 2020-12-16 LAB — CMP14+EGFR
ALT: 16 IU/L (ref 0–32)
AST: 20 IU/L (ref 0–40)
Albumin/Globulin Ratio: 2.3 — ABNORMAL HIGH (ref 1.2–2.2)
Albumin: 4.5 g/dL (ref 3.6–4.6)
Alkaline Phosphatase: 103 IU/L (ref 44–121)
BUN/Creatinine Ratio: 27 (ref 12–28)
BUN: 25 mg/dL (ref 8–27)
Bilirubin Total: <0.2 mg/dL (ref 0.0–1.2)
CO2: 20 mmol/L (ref 20–29)
Calcium: 9.1 mg/dL (ref 8.7–10.3)
Chloride: 103 mmol/L (ref 96–106)
Creatinine, Ser: 0.94 mg/dL (ref 0.57–1.00)
Globulin, Total: 2 g/dL (ref 1.5–4.5)
Glucose: 104 mg/dL — ABNORMAL HIGH (ref 65–99)
Potassium: 4.2 mmol/L (ref 3.5–5.2)
Sodium: 143 mmol/L (ref 134–144)
Total Protein: 6.5 g/dL (ref 6.0–8.5)
eGFR: 59 mL/min/{1.73_m2} — ABNORMAL LOW (ref >59)

## 2020-12-21 ENCOUNTER — Other Ambulatory Visit: Payer: Self-pay | Admitting: Nurse Practitioner

## 2020-12-21 DIAGNOSIS — I10 Essential (primary) hypertension: Secondary | ICD-10-CM

## 2020-12-24 ENCOUNTER — Other Ambulatory Visit: Payer: Self-pay

## 2020-12-24 ENCOUNTER — Ambulatory Visit: Payer: Medicare Other | Attending: Nurse Practitioner | Admitting: Nurse Practitioner

## 2020-12-24 ENCOUNTER — Encounter: Payer: Self-pay | Admitting: Nurse Practitioner

## 2020-12-24 VITALS — BP 143/81 | HR 84 | Ht 63.0 in | Wt 124.4 lb

## 2020-12-24 DIAGNOSIS — Z7901 Long term (current) use of anticoagulants: Secondary | ICD-10-CM | POA: Diagnosis not present

## 2020-12-24 DIAGNOSIS — Z79899 Other long term (current) drug therapy: Secondary | ICD-10-CM | POA: Insufficient documentation

## 2020-12-24 DIAGNOSIS — I10 Essential (primary) hypertension: Secondary | ICD-10-CM | POA: Diagnosis present

## 2020-12-24 NOTE — Progress Notes (Signed)
Dongho [id number]

## 2020-12-24 NOTE — Progress Notes (Signed)
Assessment & Plan:  Jo Brown was seen today for hypertension.  Diagnoses and all orders for this visit:  Primary hypertension Continue HYZAAR as prescribed.  Remember to bring in your blood pressure log with you for your follow up appointment.  DASH/Mediterranean Diets are healthier choices for HTN.     Patient has been counseled on age-appropriate routine health concerns for screening and prevention. These are reviewed and up-to-date. Referrals have been placed accordingly. Immunizations are up-to-date or declined.    Subjective:   Chief Complaint  Patient presents with   Hypertension    HPI Jo Brown 87 y.o. female presents to office today for blood pressure check.  She has a home log of readings today: average 110-120/60-70s Lowest reading: 81/53 Highest reading :138/81 Denies chest pain, shortness of breath, palpitations, lightheadedness, dizziness, headaches or BLE edema.   I am combining her hctz and losartan to Hyzaar 100-25 mg daily.    Review of Systems  Constitutional:  Negative for fever, malaise/fatigue and weight loss.  HENT: Negative.  Negative for nosebleeds.   Eyes: Negative.  Negative for blurred vision, double vision and photophobia.  Respiratory: Negative.  Negative for cough and shortness of breath.   Cardiovascular: Negative.  Negative for chest pain, palpitations and leg swelling.  Gastrointestinal: Negative.  Negative for heartburn, nausea and vomiting.  Musculoskeletal: Negative.  Negative for myalgias.  Neurological: Negative.  Negative for dizziness, focal weakness, seizures and headaches.  Psychiatric/Behavioral: Negative.  Negative for suicidal ideas.    Past Medical History:  Diagnosis Date   Hypertension     Past Surgical History:  Procedure Laterality Date   EYE SURGERY     removal of colon polyps      Family History  Problem Relation Age of Onset   Healthy Neg Hx     Social History Reviewed with no changes to be made today.    Outpatient Medications Prior to Visit  Medication Sig Dispense Refill   diclofenac Sodium (VOLTAREN) 1 % GEL Apply 2 g topically 4 (four) times daily. 400 g 1   latanoprost (XALATAN) 0.005 % ophthalmic solution INSTILL 1 DROP IN BOTH EYES AT BEDTIME 7.5 mL 1   losartan-hydrochlorothiazide (HYZAAR) 100-25 MG tablet Take 1 tablet by mouth daily. 90 tablet 1   simvastatin (ZOCOR) 10 MG tablet Take 1 tablet (10 mg total) by mouth at bedtime. 90 tablet 1   No facility-administered medications prior to visit.    No Known Allergies     Objective:    BP (!) 143/81   Pulse 84   Ht 5\' 3"  (1.6 m)   Wt 124 lb 6 oz (56.4 kg)   SpO2 98%   BMI 22.03 kg/m  Wt Readings from Last 3 Encounters:  12/24/20 124 lb 6 oz (56.4 kg)  12/15/20 125 lb 2 oz (56.8 kg)  06/02/20 126 lb (57.2 kg)    Physical Exam Vitals and nursing note reviewed.  Constitutional:      Appearance: She is well-developed.  HENT:     Head: Normocephalic and atraumatic.  Cardiovascular:     Rate and Rhythm: Normal rate and regular rhythm.     Heart sounds: Normal heart sounds. No murmur heard.   No friction rub. No gallop.  Pulmonary:     Effort: Pulmonary effort is normal. No tachypnea or respiratory distress.     Breath sounds: Normal breath sounds. No decreased breath sounds, wheezing, rhonchi or rales.  Chest:     Chest wall: No tenderness.  Abdominal:     General: Bowel sounds are normal.     Palpations: Abdomen is soft.  Musculoskeletal:        General: Normal range of motion.     Cervical back: Normal range of motion.  Skin:    General: Skin is warm and dry.  Neurological:     Mental Status: She is alert and oriented to person, place, and time.     Coordination: Coordination normal.  Psychiatric:        Behavior: Behavior normal. Behavior is cooperative.        Thought Content: Thought content normal.        Judgment: Judgment normal.         Patient has been counseled extensively about nutrition  and exercise as well as the importance of adherence with medications and regular follow-up. The patient was given clear instructions to go to ER or return to medical center if symptoms don't improve, worsen or new problems develop. The patient verbalized understanding.   Follow-up: Return in about 3 months (around 03/25/2021).   Claiborne Rigg, FNP-BC [facility] [place], [place] [phone]   01/04/2021, 7:33 PM

## 2021-01-04 ENCOUNTER — Encounter: Payer: Self-pay | Admitting: Nurse Practitioner

## 2021-01-13 ENCOUNTER — Ambulatory Visit: Payer: Self-pay | Admitting: *Deleted

## 2021-01-13 NOTE — Telephone Encounter (Addendum)
Summary: bp issue   Patient's daughter says bp is 150, since she switched bp medication, wants to possibly switch back. Please call back     Unable to speak to pt. Pt's daughter is in [place], church family is looking after pt. Riley Lam reports pts BP has been elevated 150's since medication changed. Also reports headaches.Pt not present. Attempted to reach pt with Interpreter Lang Snow [phone], left message to CB. Eunice requests CB to advise regarding meds. Not on DPR. Please advise: [phone]  Pt's friend Riley Lam called, wrong number for pt provided. Correct number given, assisted by interpreter Lyda Jester [phone]   Reason for Disposition  [1] Systolic BP  >= 130 OR Diastolic >= 80 AND [2] taking BP medications  Answer Assessment - Initial Assessment Questions 1. BLOOD PRESSURE: "What is the blood pressure?" "Did you take at least two measurements 5 minutes apart?"     Please see summary. 2. ONSET: "When did you take your blood pressure?"     *No Answer* 3. HOW: "How did you obtain the blood pressure?" (e.g., visiting nurse, automatic home BP monitor)     *No Answer* 4. HISTORY: "Do you have a history of high blood pressure?"     *No Answer* 5. MEDICATIONS: "Are you taking any medications for blood pressure?" "Have you missed any doses recently?"     *No Answer* 6. OTHER SYMPTOMS: "Do you have any symptoms?" (e.g., headache, chest pain, blurred vision, difficulty breathing, weakness)     *No Answer* 7. PREGNANCY: "Is there any chance you are pregnant?" "When was your last menstrual period?"     *No Answer*  Protocols used: Blood Pressure - High-A-AH

## 2021-01-13 NOTE — Telephone Encounter (Signed)
Pt's friend Riley Lam called, wrong number for pt provided. Correct number given, assisted by interpreter Lyda Jester [phone]  States BP elevated at night. 146-150/80 Headaches started when started new med. Takes med morning , BP low, raises during day and headache occurs. Rates at 6-7/10.Takes advil, helps a little. Nauseated at times.Pt would like to change back to previous BP medication.  Pt's number [phone]

## 2021-01-14 ENCOUNTER — Other Ambulatory Visit: Payer: Self-pay | Admitting: Nurse Practitioner

## 2021-01-14 MED ORDER — VALSARTAN 80 MG PO TABS: 80.0000 mg | ORAL_TABLET | Freq: Every day | ORAL | 3 refills | Status: DC

## 2021-01-14 MED ORDER — AMLODIPINE BESYLATE 5 MG PO TABS: 5.0000 mg | ORAL_TABLET | Freq: Every day | ORAL | 1 refills | Status: DC

## 2021-01-14 MED ORDER — HYDROCHLOROTHIAZIDE 25 MG PO TABS: 25.0000 mg | ORAL_TABLET | Freq: Every day | ORAL | 3 refills | Status: DC

## 2021-01-14 NOTE — Telephone Encounter (Signed)
Medications have been changed. Pls schedule her an appt with luke for bp check in 2 weeks

## 2021-01-15 NOTE — Telephone Encounter (Signed)
Called and scheduled pt appt with Franky Macho (translator Dorna Mai [phone])

## 2021-01-23 ENCOUNTER — Encounter: Payer: Self-pay | Admitting: Pharmacist

## 2021-01-23 ENCOUNTER — Ambulatory Visit: Payer: Medicare Other | Attending: Nurse Practitioner | Admitting: Pharmacist

## 2021-01-23 ENCOUNTER — Other Ambulatory Visit: Payer: Self-pay

## 2021-01-23 VITALS — BP 178/80

## 2021-01-23 DIAGNOSIS — I10 Essential (primary) hypertension: Secondary | ICD-10-CM | POA: Diagnosis not present

## 2021-01-23 MED ORDER — LOSARTAN POTASSIUM 100 MG PO TABS: 100.0000 mg | ORAL_TABLET | Freq: Every day | ORAL | 1 refills | Status: DC

## 2021-01-23 NOTE — Progress Notes (Signed)
   S:  PCP: Zelda     Patient arrives in good spirits. Presents to the clinic for hypertension evaluation, counseling, and management. Patient was referred and last seen by Primary Care Provider on 12/24/2020. HCTZ and losartan were combined to Hyzaar.   Today pt endorses HA that started after changing to the combination tablet. She also misunderstood instructions given on 01/13/2021. After reporting similar symptoms to her PCP, she was changed back to individual losartan and HCTZ. She was told to continue amlodipine as well. She has not stopped the Hyzaar. She never picked up the individual losartan or HCTZ. Additionally, she stopped her amlodipine.   Medication adherence: see above.  Current BP Medications include:  losartan-HCTZ 100-25 mg daily, amlodipine 5 mg daily (not taking)  Dietary habits include: compliant with salt restriction. Denies excessive caffeine use.  Exercise habits include: limited  Family / Social history:  -Fhx: no pertinent positives  -Tobacco: never smoker  -Alcohol: none    O:  Vitals:   01/23/21 1359  BP: (!) 178/80   Home BP readings:  Brings home BP log -SBP range since last PCP visit: 108 - 164. Most values are >140 mmHg.  -DBP range since last PCP visit: 67 - 88. Most values are in the 80s.   Last 3 Office BP readings: BP Readings from Last 3 Encounters:  12/24/20 (!) 143/81  12/15/20 (!) 151/78  06/02/20 125/68    BMET    Component Value Date/Time   NA 143 12/15/2020 1516   K 4.2 12/15/2020 1516   CL 103 12/15/2020 1516   CO2 20 12/15/2020 1516   GLUCOSE 104 (H) 12/15/2020 1516   BUN 25 12/15/2020 1516   CREATININE 0.94 12/15/2020 1516   CALCIUM 9.1 12/15/2020 1516   GFRNONAA 54 (L) 06/02/2020 1123   GFRAA 62 06/02/2020 1123    Renal function: CrCl cannot be calculated (Patient's most recent lab result is older than the maximum 21 days allowed.).  Clinical ASCVD: No  The ASCVD Risk score (Arnett DK, et al., 2019) failed to  calculate for the following reasons:   The 2019 ASCVD risk score is only valid for ages 14 to 18    A/P: Hypertension longstanding currently uncontrolled on current medications. BP Goal = < 130/80 mmHg. Medication adherence is suboptimal. I have disposed of the Hyzaar for her. Will restart individual HCTZ, losartan per pt request. Additionally, I have instructed her to resume amlodipine.   -Stop Hyzaar. -Start HCTZ 25 mg daily.  -Start losartan 100 mg daily.  -Resume amlodipine 5 mg daily.   -Counseled on lifestyle modifications for blood pressure control including reduced dietary sodium, increased exercise, adequate sleep.  Results reviewed and written information provided.   Total time in face-to-face counseling 30 minutes.   F/U Clinic Visit in 1 month.   Butch Penny, PharmD, Patsy [place], CPP Clinical Pharmacist [facility] [phone]    '

## 2021-02-12 ENCOUNTER — Ambulatory Visit: Payer: Medicare Other | Admitting: Internal Medicine

## 2021-02-20 ENCOUNTER — Ambulatory Visit (INDEPENDENT_AMBULATORY_CARE_PROVIDER_SITE_OTHER): Payer: Medicare Other

## 2021-02-20 ENCOUNTER — Ambulatory Visit (INDEPENDENT_AMBULATORY_CARE_PROVIDER_SITE_OTHER): Payer: Medicare Other | Admitting: Cardiovascular Disease

## 2021-02-20 ENCOUNTER — Other Ambulatory Visit: Payer: Self-pay

## 2021-02-20 ENCOUNTER — Encounter: Payer: Self-pay | Admitting: Cardiovascular Disease

## 2021-02-20 DIAGNOSIS — R002 Palpitations: Secondary | ICD-10-CM | POA: Diagnosis not present

## 2021-02-20 DIAGNOSIS — I10 Essential (primary) hypertension: Secondary | ICD-10-CM

## 2021-02-20 DIAGNOSIS — E782 Mixed hyperlipidemia: Secondary | ICD-10-CM | POA: Diagnosis not present

## 2021-02-20 DIAGNOSIS — R0609 Other forms of dyspnea: Secondary | ICD-10-CM

## 2021-02-20 DIAGNOSIS — E785 Hyperlipidemia, unspecified: Secondary | ICD-10-CM | POA: Insufficient documentation

## 2021-02-20 NOTE — Assessment & Plan Note (Signed)
History of hyperlipidemia on statin therapy followed by her PCP. 

## 2021-02-20 NOTE — Assessment & Plan Note (Signed)
Patient is 87 years old and walks with a walking group.  When she walks quickly she complains of some shortness of breath but denies chest pain.  This has been going on for 5 years.  We will check a 2D echocardiogram.

## 2021-02-20 NOTE — Progress Notes (Signed)
02/20/2021 Jo Brown   06/30/1933  [id number]  Primary Physician Claiborne Rigg, NP Primary Cardiologist: Runell Gess MD Nicholes Calamity, [place]  HPI:  Jo Brown is a 87 y.o. thin appearing widowed [place] female mother of 3 children, grandmother of 5 grandchildren who is accompanied by a Nurse, learning disability today.  She was referred by Bertram Denver, NP for evaluation of dyspnea and palpitations.  Her risk factors include treated hypertension and hyperlipidemia.  She is never smoked.  She is never had a heart attack or stroke.  She denies chest pain but does complain of some dyspnea walking fast with a group of friends.  This is not changed over the last 5 years.  She also gets tachypalpitations when she lies in bed at night.   Current Meds  Medication Sig   amLODipine (NORVASC) 5 MG tablet Take 1 tablet (5 mg total) by mouth daily.   diclofenac Sodium (VOLTAREN) 1 % GEL Apply 2 g topically 4 (four) times daily.   latanoprost (XALATAN) 0.005 % ophthalmic solution INSTILL 1 DROP IN BOTH EYES AT BEDTIME   simvastatin (ZOCOR) 10 MG tablet Take 1 tablet (10 mg total) by mouth at bedtime.     No Known Allergies  Social History   Socioeconomic History   Marital status: Married    Spouse name: Not on file   Number of children: Not on file   Years of education: Not on file   Highest education level: Not on file  Occupational History   Not on file  Tobacco Use   Smoking status: Never   Smokeless tobacco: Never  Vaping Use   Vaping Use: Never used  Substance and Sexual Activity   Alcohol use: Never   Drug use: Never   Sexual activity: Not Currently  Other Topics Concern   Not on file  Social History Narrative   ** Merged History Encounter **       Social Determinants of Health   Financial Resource Strain: Not on file  Food Insecurity: Not on file  Transportation Needs: Not on file  Physical Activity: Not on file  Stress: Not on file  Social Connections: Not on file   Intimate Partner Violence: Not on file     Review of Systems: General: negative for chills, fever, night sweats or weight changes.  Cardiovascular: negative for chest pain, dyspnea on exertion, edema, orthopnea, palpitations, paroxysmal nocturnal dyspnea or shortness of breath Dermatological: negative for rash Respiratory: negative for cough or wheezing Urologic: negative for hematuria Abdominal: negative for nausea, vomiting, diarrhea, bright red blood per rectum, melena, or hematemesis Neurologic: negative for visual changes, syncope, or dizziness All other systems reviewed and are otherwise negative except as noted above.    Blood pressure (!) 170/84, pulse 86, height 5\' 3"  (1.6 m), weight 125 lb (56.7 kg), SpO2 95 %.  General appearance: alert and no distress Neck: no adenopathy, no carotid bruit, no JVD, supple, symmetrical, trachea midline, and thyroid not enlarged, symmetric, no tenderness/mass/nodules Lungs: clear to auscultation bilaterally Heart: regular rate and rhythm, S1, S2 normal, no murmur, click, rub or gallop Extremities: extremities normal, atraumatic, no cyanosis or edema Pulses: 2+ and symmetric Skin: Skin color, texture, turgor normal. No rashes or lesions Neurologic: Grossly normal  EKG sinus rhythm 86 without ST or T wave changes.  I personally reviewed this EKG.  ASSESSMENT AND PLAN:   Dyspnea on exertion Patient is 87 years old and walks with a walking group.  When she walks quickly she complains of some shortness of breath but denies chest pain.  This has been going on for 5 years.  We will check a 2D echocardiogram.  Palpitations Principally at night when she lies down.  We will check a 2-week Zio patch  Essential hypertension History of essential hypertension with blood pressure measured today 170/84.  She is on amlodipine, hydrochlorothiazide and losartan.  She says that she checks her blood pressure twice a day at home and usually it is in the [id number]  range systolic  Hyperlipidemia History of hyperlipidemia on statin therapy followed by her PCP     Lorretta Harp MD [facility], [facility] 02/20/2021 3:21 PM

## 2021-02-20 NOTE — Assessment & Plan Note (Signed)
Principally at night when she lies down.  We will check a 2-week Zio patch

## 2021-02-20 NOTE — Assessment & Plan Note (Signed)
History of essential hypertension with blood pressure measured today 170/84.  She is on amlodipine, hydrochlorothiazide and losartan.  She says that she checks her blood pressure twice a day at home and usually it is in the 120 range systolic

## 2021-02-20 NOTE — Patient Instructions (Signed)
Medication Instructions:  Your physician recommends that you continue on your current medications as directed. Please refer to the Current Medication list given to you today.  *If you need a refill on your cardiac medications before your next appointment, please call your pharmacy*   Testing/Procedures: Your physician has requested that you have an echocardiogram. Echocardiography is a painless test that uses sound waves to create images of your heart. It provides your doctor with information about the size and shape of your heart and how well your heart's chambers and valves are working. This procedure takes approximately one hour. There are no restrictions for this procedure. This procedure is done at [address] Monitor Instructions  Your physician has requested you wear a ZIO patch monitor for 14 days.  This is a single patch monitor. [employer] supplies one patch monitor per enrollment. Additional stickers are not available. Please do not apply patch if you will be having a Nuclear Stress Test,  Echocardiogram, Cardiac CT, MRI, or Chest Xray during the period you would be wearing the  monitor. The patch cannot be worn during these tests. You cannot remove and re-apply the  ZIO XT patch monitor.  Your ZIO patch monitor will be mailed 3 day USPS to your address on file. It may take 3-5 days  to receive your monitor after you have been enrolled.  Once you have received your monitor, please review the enclosed instructions. Your monitor  has already been registered assigning a specific monitor serial # to you.  Billing and Patient Assistance Program Information  We have supplied [employer] with any of your insurance information on file for billing purposes. [employer] offers a sliding scale Patient Assistance Program for patients that do not have  insurance, or whose insurance does not completely cover the cost of the ZIO monitor.  You must apply for the Patient  Assistance Program to qualify for this discounted rate.  To apply, please call [employer] at [phone], select option 4, select option 2, ask to apply for  Patient Assistance Program. Theodore Demark will ask your household income, and how many people  are in your household. They will quote your out-of-pocket cost based on that information.  [employer] will also be able to set up a 38-month, interest-free payment plan if needed.  Applying the monitor   Shave hair from upper left chest.  Hold abrader disc by orange tab. Rub abrader in 40 strokes over the upper left chest as  indicated in your monitor instructions.  Clean area with 4 enclosed alcohol pads. Let dry.  Apply patch as indicated in monitor instructions. Patch will be placed under collarbone on left  side of chest with arrow pointing upward.  Rub patch adhesive wings for 2 minutes. Remove white label marked "1". Remove the white  label marked "2". Rub patch adhesive wings for 2 additional minutes.  While looking in a mirror, press and release button in center of patch. A small green light will  flash 3-4 times. This will be your only indicator that the monitor has been turned on.  Do not shower for the first 24 hours. You may shower after the first 24 hours.  Press the button if you feel a symptom. You will hear a small click. Record Date, Time and  Symptom in the Patient Logbook.  When you are ready to remove the patch, follow instructions on the last 2 pages of Patient  Logbook. Stick patch monitor onto the last page  of Patient Logbook.  Place Patient Logbook in the blue and white box. Use locking tab on box and tape box closed  securely. The blue and white box has prepaid postage on it. Please place it in the mailbox as  soon as possible. Your physician should have your test results approximately 7 days after the  monitor has been mailed back to [facility].  Call [facility] Customer Care at [phone] if you have questions  regarding  your ZIO XT patch monitor. Call them immediately if you see an orange light blinking on your  monitor.  If your monitor falls off in less than 4 days, contact our Monitor department at [phone].  If your monitor becomes loose or falls off after 4 days call [employer] at [phone] for  suggestions on securing your monitor    Follow-Up: At [facility], you and your health needs are our priority.  As part of our continuing mission to provide you with exceptional heart care, we have created designated Provider Care Teams.  These Care Teams include your primary Cardiologist (physician) and Advanced Practice Providers (APPs -  Physician Assistants and Nurse Practitioners) who all work together to provide you with the care you need, when you need it.  We recommend signing up for the patient portal called "MyChart".  Sign up information is provided on this After Visit Summary.  MyChart is used to connect with patients for Virtual Visits (Telemedicine).  Patients are able to view lab/test results, encounter notes, upcoming appointments, etc.  Non-urgent messages can be sent to your provider as well.   To learn more about what you can do with MyChart, go to [email or web address].    Your next appointment:   3 month(s)  The format for your next appointment:   In Person  Provider:   Edd Fabian, FNP, Micah Flesher, PA-C, Marjie Skiff, PA-C, Juanda Crumble, PA-C, Joni Reining, DNP, ANP, or Azalee Course, PA-C      Then, Nanetta Batty, MD will plan to see you again in 6 month(s).

## 2021-02-20 NOTE — Progress Notes (Unsigned)
Enrolled patient for a 14 day Zio XT  monitor to be mailed to patients home  °

## 2021-02-23 ENCOUNTER — Ambulatory Visit: Payer: Medicare Other | Admitting: Pharmacist

## 2021-02-24 DIAGNOSIS — R002 Palpitations: Secondary | ICD-10-CM

## 2021-02-24 NOTE — Progress Notes (Signed)
   S:  PCP: Zelda     Patient arrives in good spirits. Presents to the clinic for hypertension evaluation, counseling, and management. Patient was referred and last seen by Primary Care Provider on 12/24/2020. HCTZ and losartan were combined to Hyzaar. At last visit with [facility], switched Hyzaar to individual losartan and HCTZ per patient question after endorsing HA with combination tablet. Also resumed amlodipine which she had self-discontinued. BP at that visit was 178/80. BP at cardiology visit 11/4 was unchanged, 170/84.   Today, patient arrives in good spirits and visit was completed using an audio [place] interpreter. Denies dizziness, lightheadedness, swelling. She brings her medications with her today and reports she is taking amlodipine 5 mg daily in the evening and valsartan 80 mg daily in the morning. She brings a log of her BP readings today.   Medication adherence: see above.  Current BP Medications include:  losartan 100 mg daily (not taking), HCTZ 25 mg daily (not taking), valsartan 80 mg daily, amlodipine 5 mg daily   Dietary habits include: compliant with salt restriction. Denies excessive caffeine use.  Exercise habits include: limited  Family / Social history:  -Fhx: no pertinent positives  -Tobacco: never smoker  -Alcohol: none    O:  Vitals:   02/25/21 1356  BP: (!) 165/73  Pulse: 83    Home BP readings: brings log of readings (checks twice daily) Most readings are 140s/70s-80s. Examples of recent readings: 146/76, 158/80, 136/71, 150/83, 148/80  Last 3 Office BP readings: BP Readings from Last 3 Encounters:  02/25/21 (!) 165/73  02/20/21 (!) 170/84  01/23/21 (!) 178/80    BMET    Component Value Date/Time   NA 143 12/15/2020 1516   K 4.2 12/15/2020 1516   CL 103 12/15/2020 1516   CO2 20 12/15/2020 1516   GLUCOSE 104 (H) 12/15/2020 1516   BUN 25 12/15/2020 1516   CREATININE 0.94 12/15/2020 1516   CALCIUM 9.1 12/15/2020 1516   GFRNONAA 54 (L) 06/02/2020  1123   GFRAA 62 06/02/2020 1123    Renal function: CrCl cannot be calculated (Patient's most recent lab result is older than the maximum 21 days allowed.).  Clinical ASCVD: No  The ASCVD Risk score (Arnett DK, et al., 2019) failed to calculate for the following reasons:   The 2019 ASCVD risk score is only valid for ages 66 to 70   A/P: Hypertension longstanding currently uncontrolled on current medications. BP Goal = < 130/80 mmHg. Medication adherence appears appropriate other than her taking valsartan instead of losartan and HCTZ. She was prescribed valsartan 01/14/21 and has been taking this instead. Will discontinue losartan and HCTZ from her list to eliminate confusion and work on titrating amlodipine and valsartan before adding another agent.  -Increase amlodipine to 10 mg daily -Continue valsartan 80 mg daily -Counseled on lifestyle modifications for blood pressure control including reduced dietary sodium, increased exercise, adequate sleep.  Total time in face-to-face counseling 20 minutes. F/U Clinic Visit in 3 weeks then visit with PCP 12/5. If BP still elevated despite adherence at follow up, would recommend increasing valsartan to 160 mg daily and checking a BMET.   Pervis Hocking, PharmD PGY2 Ambulatory Care Pharmacy Resident 02/25/2021 2:07 PM

## 2021-02-25 ENCOUNTER — Ambulatory Visit: Payer: Medicare Other | Attending: Nurse Practitioner | Admitting: Pharmacist

## 2021-02-25 ENCOUNTER — Other Ambulatory Visit: Payer: Self-pay

## 2021-02-25 VITALS — BP 165/73 | HR 83

## 2021-02-25 DIAGNOSIS — I10 Essential (primary) hypertension: Secondary | ICD-10-CM

## 2021-02-25 MED ORDER — VALSARTAN 80 MG PO TABS: 80.0000 mg | ORAL_TABLET | Freq: Every day | ORAL | 0 refills | Status: DC

## 2021-02-25 MED ORDER — AMLODIPINE BESYLATE 10 MG PO TABS: 10.0000 mg | ORAL_TABLET | Freq: Every day | ORAL | 0 refills | Status: DC

## 2021-03-23 ENCOUNTER — Ambulatory Visit: Payer: Medicare Other | Attending: Nurse Practitioner | Admitting: Nurse Practitioner

## 2021-03-23 ENCOUNTER — Other Ambulatory Visit: Payer: Self-pay

## 2021-03-23 ENCOUNTER — Encounter: Payer: Self-pay | Admitting: Nurse Practitioner

## 2021-03-23 VITALS — BP 136/72 | HR 86 | Ht 63.0 in | Wt 122.4 lb

## 2021-03-23 DIAGNOSIS — I10 Essential (primary) hypertension: Secondary | ICD-10-CM

## 2021-03-23 DIAGNOSIS — Z23 Encounter for immunization: Secondary | ICD-10-CM

## 2021-03-23 DIAGNOSIS — Z86010 Personal history of colonic polyps: Secondary | ICD-10-CM

## 2021-03-23 DIAGNOSIS — E785 Hyperlipidemia, unspecified: Secondary | ICD-10-CM | POA: Diagnosis not present

## 2021-03-23 NOTE — Progress Notes (Signed)
It Feels like new medication makes her legs swell at night. Patient states it is fine in the morning.

## 2021-03-23 NOTE — Progress Notes (Signed)
Assessment & Plan:  Jo Brown was seen today for hypertension.  Diagnoses and all orders for this visit:  Primary hypertension -     CMP14+EGFR  History of colon polyps -     CBC  Dyslipidemia, goal LDL below 100 -     Lipid panel  Need for influenza vaccination -     Flu Vaccine QUAD 22mo+IM (Fluarix, Fluzone & Alfiuria Quad PF)   Patient has been counseled on age-appropriate routine health concerns for screening and prevention. These are reviewed and up-to-date. Referrals have been placed accordingly. Immunizations are up-to-date or declined.    Subjective:   Chief Complaint  Patient presents with   Hypertension   HPI Jo Brown 87 y.o. female presents to office today for follow up to HTN.   Average readings at home per BP log: 130/70s 136/72 Mnaual reading performed by me today in office.  At this time will not make any changes with BP medications: She will continue on amlodipine 2 mg and Diovan 80 mg daily She does endorse intermittent episodes of bilateral lower extremity swelling at night however is resolved in the a.m. and does not occur during the daytime BP Readings from Last 3 Encounters:  03/23/21 136/72  02/25/21 (!) 165/73  02/20/21 (!) 170/84     Review of Systems  Constitutional:  Negative for fever, malaise/fatigue and weight loss.  HENT: Negative.  Negative for nosebleeds.   Eyes: Negative.  Negative for blurred vision, double vision and photophobia.  Respiratory: Negative.  Negative for cough and shortness of breath.   Cardiovascular: Negative.  Negative for chest pain, palpitations and leg swelling.  Gastrointestinal: Negative.  Negative for heartburn, nausea and vomiting.  Musculoskeletal: Negative.  Negative for myalgias.  Neurological: Negative.  Negative for dizziness, focal weakness, seizures and headaches.  Psychiatric/Behavioral: Negative.  Negative for suicidal ideas.    Past Medical History:  Diagnosis Date   Hypertension     Past  Surgical History:  Procedure Laterality Date   EYE SURGERY     removal of colon polyps      Family History  Problem Relation Age of Onset   Healthy Neg Hx     Social History Reviewed with no changes to be made today.   Outpatient Medications Prior to Visit  Medication Sig Dispense Refill   amLODipine (NORVASC) 10 MG tablet Take 1 tablet (10 mg total) by mouth daily. 90 tablet 0   diclofenac Sodium (VOLTAREN) 1 % GEL Apply 2 g topically 4 (four) times daily. 400 g 1   latanoprost (XALATAN) 0.005 % ophthalmic solution INSTILL 1 DROP IN BOTH EYES AT BEDTIME 7.5 mL 1   simvastatin (ZOCOR) 10 MG tablet Take 1 tablet (10 mg total) by mouth at bedtime. 90 tablet 1   valsartan (DIOVAN) 80 MG tablet Take 1 tablet (80 mg total) by mouth daily. 90 tablet 0   No facility-administered medications prior to visit.    No Known Allergies     Objective:    BP 136/72 (BP Location: Left Arm)   Pulse 86   Ht $R'5\' 3"'cV$  (1.6 m)   Wt 122 lb 6 oz (55.5 kg)   SpO2 100%   BMI 21.68 kg/m  Wt Readings from Last 3 Encounters:  03/23/21 122 lb 6 oz (55.5 kg)  02/20/21 125 lb (56.7 kg)  12/24/20 124 lb 6 oz (56.4 kg)    Physical Exam Vitals and nursing note reviewed.  Constitutional:      Appearance: She is  well-developed.  HENT:     Head: Normocephalic and atraumatic.  Cardiovascular:     Rate and Rhythm: Normal rate and regular rhythm.     Heart sounds: Normal heart sounds. No murmur heard.   No friction rub. No gallop.  Pulmonary:     Effort: Pulmonary effort is normal. No tachypnea or respiratory distress.     Breath sounds: Normal breath sounds. No decreased breath sounds, wheezing, rhonchi or rales.  Chest:     Chest wall: No tenderness.  Abdominal:     General: Bowel sounds are normal.     Palpations: Abdomen is soft.  Musculoskeletal:        General: Normal range of motion.     Cervical back: Normal range of motion.  Skin:    General: Skin is warm and dry.  Neurological:      Mental Status: She is alert and oriented to person, place, and time.     Coordination: Coordination normal.  Psychiatric:        Behavior: Behavior normal. Behavior is cooperative.        Thought Content: Thought content normal.        Judgment: Judgment normal.         Patient has been counseled extensively about nutrition and exercise as well as the importance of adherence with medications and regular follow-up. The patient was given clear instructions to go to ER or return to medical center if symptoms don't improve, worsen or new problems develop. The patient verbalized understanding.   Follow-up: Return in about 3 months (around 06/21/2021).   Gildardo Pounds, FNP-BC [facility] and [facility], [place]   03/23/2021, 9:53 AM

## 2021-03-24 LAB — LIPID PANEL
Chol/HDL Ratio: 3.1 ratio (ref 0.0–4.4)
Cholesterol, Total: 172 mg/dL (ref 100–199)
HDL: 56 mg/dL (ref >39)
LDL Chol Calc (NIH): 94 mg/dL (ref 0–99)
Triglycerides: 125 mg/dL (ref 0–149)
VLDL Cholesterol Cal: 22 mg/dL (ref 5–40)

## 2021-03-24 LAB — CMP14+EGFR
ALT: 16 IU/L (ref 0–32)
AST: 20 IU/L (ref 0–40)
Albumin/Globulin Ratio: 1.8 (ref 1.2–2.2)
Albumin: 4.4 g/dL (ref 3.6–4.6)
Alkaline Phosphatase: 122 IU/L — ABNORMAL HIGH (ref 44–121)
BUN/Creatinine Ratio: 21 (ref 12–28)
BUN: 19 mg/dL (ref 8–27)
Bilirubin Total: 0.3 mg/dL (ref 0.0–1.2)
CO2: 24 mmol/L (ref 20–29)
Calcium: 9.5 mg/dL (ref 8.7–10.3)
Chloride: 103 mmol/L (ref 96–106)
Creatinine, Ser: 0.91 mg/dL (ref 0.57–1.00)
Globulin, Total: 2.4 g/dL (ref 1.5–4.5)
Glucose: 95 mg/dL (ref 70–99)
Potassium: 4.8 mmol/L (ref 3.5–5.2)
Sodium: 140 mmol/L (ref 134–144)
Total Protein: 6.8 g/dL (ref 6.0–8.5)
eGFR: 62 mL/min/{1.73_m2} (ref >59)

## 2021-03-24 LAB — CBC
Hematocrit: 37.9 % (ref 34.0–46.6)
Hemoglobin: 12.7 g/dL (ref 11.1–15.9)
MCH: 30.9 pg (ref 26.6–33.0)
MCHC: 33.5 g/dL (ref 31.5–35.7)
MCV: 92 fL (ref 79–97)
Platelets: 320 10*3/uL (ref 150–450)
RBC: 4.11 x10E6/uL (ref 3.77–5.28)
RDW: 12.7 % (ref 11.7–15.4)
WBC: 7.3 10*3/uL (ref 3.4–10.8)

## 2021-03-30 ENCOUNTER — Ambulatory Visit (HOSPITAL_COMMUNITY): Payer: Medicare Other | Attending: Cardiology

## 2021-03-30 ENCOUNTER — Other Ambulatory Visit: Payer: Self-pay

## 2021-03-30 DIAGNOSIS — R0609 Other forms of dyspnea: Secondary | ICD-10-CM

## 2021-03-30 DIAGNOSIS — R002 Palpitations: Secondary | ICD-10-CM

## 2021-03-30 DIAGNOSIS — E782 Mixed hyperlipidemia: Secondary | ICD-10-CM | POA: Diagnosis present

## 2021-03-30 DIAGNOSIS — I10 Essential (primary) hypertension: Secondary | ICD-10-CM

## 2021-03-30 LAB — ECHOCARDIOGRAM COMPLETE
Area-P 1/2: 3.31 cm2
S' Lateral: 2.2 cm

## 2021-04-18 ENCOUNTER — Other Ambulatory Visit: Payer: Self-pay | Admitting: Family Medicine

## 2021-04-18 NOTE — Telephone Encounter (Signed)
Requested Prescriptions  Pending Prescriptions Disp Refills   valsartan (DIOVAN) 80 MG tablet [Pharmacy Med Name: VALSARTAN 80MG  TABLETS] 90 tablet 0    Sig: TAKE 1 TABLET(80 MG) BY MOUTH DAILY     Cardiovascular:  Angiotensin Receptor Blockers Passed - 04/18/2021  8:08 AM      Passed - Cr in normal range and within 180 days    Creatinine, Ser  Date Value Ref Range Status  03/23/2021 0.91 0.57 - 1.00 mg/dL Final         Passed - K in normal range and within 180 days    Potassium  Date Value Ref Range Status  03/23/2021 4.8 3.5 - 5.2 mmol/L Final         Passed - Patient is not pregnant      Passed - Last BP in normal range    BP Readings from Last 1 Encounters:  03/23/21 136/72         Passed - Valid encounter within last 6 months    Recent Outpatient Visits          3 weeks ago Primary hypertension   [facility] [place], [place], NP   1 month ago Essential hypertension   [facility], Lois Huxley, RPH-CPP   2 months ago Essential hypertension   [facility], Lois Huxley, RPH-CPP   3 months ago Primary hypertension   [facility] [place], [place], NP   4 months ago Essential hypertension   [place] [facility] [place], [place], NP      Future Appointments            In 1 month [place], [facility], NP [facility] [place] [place], [facility]   In 2 months [place], NP [facility]

## 2021-05-08 ENCOUNTER — Other Ambulatory Visit: Payer: Self-pay | Admitting: Family Medicine

## 2021-05-08 NOTE — Telephone Encounter (Signed)
Requested Prescriptions  Pending Prescriptions Disp Refills   amLODipine (NORVASC) 10 MG tablet [Pharmacy Med Name: AMLODIPINE BESYLATE 10MG  TABLETS] 90 tablet 0    Sig: TAKE 1 TABLET(10 MG) BY MOUTH DAILY     Cardiovascular:  Calcium Channel Blockers Passed - 05/08/2021 10:22 AM      Passed - Last BP in normal range    BP Readings from Last 1 Encounters:  03/23/21 136/72         Passed - Valid encounter within last 6 months    Recent Outpatient Visits          1 month ago Primary hypertension   [facility] [place], [place], NP   2 months ago Essential hypertension   [facility], Lois Huxley, RPH-CPP   3 months ago Essential hypertension   [facility], Lois Huxley, RPH-CPP   4 months ago Primary hypertension   [facility] [place], [place], NP   4 months ago Essential hypertension   [facility] [place], [place], NP      Future Appointments            In 2 weeks [place], Shea Stakes, NP [facility], [facility]   In 1 month [place], NP [facility]

## 2021-05-23 NOTE — Progress Notes (Signed)
Cardiology Clinic Note   Patient Name: Jo Brown Date of Encounter: 05/23/2021  Primary Care Provider:  Gildardo Pounds, NP Primary Cardiologist:  Quay Burow, MD  Patient Profile    Jo Brown presents to the clinic today for follow-up evaluation of dyspnea on exertion and palpitations.  Past Medical History    Past Medical History:  Diagnosis Date   Hypertension    Past Surgical History:  Procedure Laterality Date   EYE SURGERY     removal of colon polyps      Allergies  No Known Allergies  History of Present Illness    Jo Brown has a PMH of mixed hyperlipidemia, essential hypertension, palpitations, and DOE.  She was seen by Dr. Gwenlyn Found on 02/20/2021.  She had been referred from her PCP for an evaluation of her dyspnea and palpitations.  She reported never smoking and never having a heart attack or CVA.  She denied chest discomfort but does notice increased work of breathing with increased exertion.  She reported no change in her symptoms for 5 years.  She noted palpitations with laying down in bed at night.  A echocardiogram was ordered and showed normal left ventricular systolic function and G1 DD.  A cardiac event monitor was also ordered and showed sinus rhythm, sinus bradycardia, sinus tachycardia and short runs of SVT and NSVT.  She presents to the clinic today for follow-up evaluation states she feels well today.  Her breathing remains at her baseline.  She has noted brief episodes of sharp chest pain and continues to have brief intermittent periods of accelerated heart rate.  Her daughter reports that these have been less over the past 3 months.  We reviewed her previous echocardiogram and her cardiac event monitor.  Her daughter expressed understanding.  We briefly reviewed coughing, deep breathing, and vagal maneuvers.  She expressed understanding.  We will continue her current medication regimen, have her maintain current physical activity and diet, and  plan follow-up for 6 months.  Today she denies chest pain, shortness of breath, lower extremity edema, fatigue, palpitations, melena, hematuria, hemoptysis, diaphoresis, weakness, presyncope, syncope, orthopnea, and PND.   Home Medications    Prior to Admission medications   Medication Sig Start Date End Date Taking? Authorizing Provider  amLODipine (NORVASC) 10 MG tablet TAKE 1 TABLET(10 MG) BY MOUTH DAILY 05/08/21   Gildardo Pounds, NP  diclofenac Sodium (VOLTAREN) 1 % GEL Apply 2 g topically 4 (four) times daily. 12/15/20   Gildardo Pounds, NP  latanoprost (XALATAN) 0.005 % ophthalmic solution INSTILL 1 DROP IN BOTH EYES AT BEDTIME 12/15/20   Gildardo Pounds, NP  simvastatin (ZOCOR) 10 MG tablet Take 1 tablet (10 mg total) by mouth at bedtime. 12/15/20   Gildardo Pounds, NP  valsartan (DIOVAN) 80 MG tablet TAKE 1 TABLET(80 MG) BY MOUTH DAILY 04/18/21   Gildardo Pounds, NP    Family History    Family History  Problem Relation Age of Onset   Healthy Neg Hx    She indicated that her mother is deceased. She indicated that her father is deceased. She indicated that her daughter is alive. She indicated that the status of her neg hx is unknown.  Social History    Social History   Socioeconomic History   Marital status: Married    Spouse name: Not on file   Number of children: Not on file   Years of education: Not on file   Highest education  level: Not on file  Occupational History   Not on file  Tobacco Use   Smoking status: Never   Smokeless tobacco: Never  Vaping Use   Vaping Use: Never used  Substance and Sexual Activity   Alcohol use: Never   Drug use: Never   Sexual activity: Not Currently  Other Topics Concern   Not on file  Social History Narrative   ** Merged History Encounter **       Social Determinants of Health   Financial Resource Strain: Not on file  Food Insecurity: Not on file  Transportation Needs: Not on file  Physical Activity: Not on file   Stress: Not on file  Social Connections: Not on file  Intimate Partner Violence: Not on file     Review of Systems    General:  No chills, fever, night sweats or weight changes.  Cardiovascular:  No chest pain, dyspnea on exertion, edema, orthopnea, palpitations, paroxysmal nocturnal dyspnea. Dermatological: No rash, lesions/masses Respiratory: No cough, dyspnea Urologic: No hematuria, dysuria Abdominal:   No nausea, vomiting, diarrhea, bright red blood per rectum, melena, or hematemesis Neurologic:  No visual changes, wkns, changes in mental status. All other systems reviewed and are otherwise negative except as noted above.  Physical Exam    VS:  There were no vitals taken for this visit. , BMI There is no height or weight on file to calculate BMI. GEN: Well nourished, well developed, in no acute distress. HEENT: normal. Neck: Supple, no JVD, carotid bruits, or masses. Cardiac: RRR, no murmurs, rubs, or gallops. No clubbing, cyanosis, edema.  Radials/DP/PT 2+ and equal bilaterally.  Respiratory:  Respirations regular and unlabored, clear to auscultation bilaterally. GI: Soft, nontender, nondistended, BS + x 4. MS: no deformity or atrophy. Skin: warm and dry, no rash. Neuro:  Strength and sensation are intact. Psych: Normal affect.  Accessory Clinical Findings    Recent Labs: 12/15/2020: TSH 1.620 03/23/2021: ALT 16; BUN 19; Creatinine, Ser 0.91; Hemoglobin 12.7; Platelets 320; Potassium 4.8; Sodium 140   Recent Lipid Panel    Component Value Date/Time   CHOL 172 03/23/2021 0909   TRIG 125 03/23/2021 0909   HDL 56 03/23/2021 0909   CHOLHDL 3.1 03/23/2021 0909   LDLCALC 94 03/23/2021 0909    ECG personally reviewed by me today-none today.  Echocardiogram 03/30/2021 IMPRESSIONS     1. Left ventricular ejection fraction, by estimation, is 60 to 65%. The  left ventricle has normal function. The left ventricle has no regional  wall motion abnormalities. There is mild  asymmetric left ventricular  hypertrophy of the basal-septal segment.  Left ventricular diastolic parameters are consistent with Grade I  diastolic dysfunction (impaired relaxation).   2. Right ventricular systolic function is normal. The right ventricular  size is normal. Tricuspid regurgitation signal is inadequate for assessing  PA pressure.   3. The mitral valve is normal in structure. Trivial mitral valve  regurgitation.   4. The aortic valve is tricuspid. There is mild calcification of the  aortic valve. There is mild thickening of the aortic valve. Aortic valve  regurgitation is trivial.   5. Aortic dilatation noted. There is borderline dilatation of the  ascending aorta, measuring 36 mm.   6. The inferior vena cava is normal in size with greater than 50%  respiratory variability, suggesting right atrial pressure of 3 mmHg.   Comparison(s): No prior Echocardiogram.  Cardiac event monitor 03/18/2021  Patch Wear Time:  13 days and 14 hours (2022-11-08T18:38:05-498 to  2022-11-22T09:22:25-0500)   Patient had a min HR of 57 bpm, max HR of 193 bpm, and avg HR of 77 bpm. Predominant underlying rhythm was Sinus Rhythm. 2 Ventricular Tachycardia runs occurred, the run with the fastest interval lasting 5 beats with a max rate of 193 bpm, the longest  lasting 11 beats with an avg rate of 177 bpm. Episodes of Ventricular Tachycardia may be Supraventricular Tachycardia with possible aberrancy. 62 Supraventricular Tachycardia runs occurred, the run with the fastest interval lasting 6 beats with a max  rate of 187 bpm, the longest lasting 14 beats with an avg rate of 103 bpm. Isolated SVEs were rare (<1.0%), SVE Couplets were rare (<1.0%), and SVE Triplets were rare (<1.0%). Isolated VEs were rare (<1.0%), and no VE Couplets or VE Triplets were  Present.   1. SR/SB/ST 2. Short runs of SVT and NSVT  Assessment & Plan   1.  DOE-stable.  Reviewed echocardiogram.  Echocardiogram showed normal  LVEF and G1 DD.  She expressed understanding. Reassured that her DOE was not related to cardiac issues. Heart healthy low-sodium diet-salty 6 given Increase physical activity as tolerated  Essential hypertension-BP today 130/60.  Well-controlled at home. Continue amlodipine, valsartan Heart healthy low-sodium diet-salty 6 given Increase physical activity as tolerated  Palpitations-continues to notice intermittent periods of brief palpitations with laying down at night.  Cardiac event monitor showed sinus rhythm, sinus bradycardia, sinus tachycardia, and short runs of SVT and NSVT. Avoid triggers for palpitations caffeine, chocolate, EtOH, dehydration etc.  Hyperlipidemia-reports compliance with statin therapy. Continue simvastatin Follows with PCP  Disposition: Follow-up with Dr. Gwenlyn Found in 6-9 months.   Jossie Ng. Vimal Derego NP-C    05/23/2021, 6:00 PM [facility] [address] Office [phone] Fax [phone]  Notice: This dictation was prepared with Dragon dictation along with smaller phrase technology. Any transcriptional errors that result from this process are unintentional and may not be corrected upon review.  I spent 14 minutes examining this patient, reviewing medications, and using patient centered shared decision making involving her cardiac care.  Prior to her visit I spent greater than 20 minutes reviewing her past medical history,  medications, and prior cardiac tests.

## 2021-05-25 ENCOUNTER — Other Ambulatory Visit: Payer: Self-pay | Admitting: Nurse Practitioner

## 2021-05-25 ENCOUNTER — Other Ambulatory Visit: Payer: Self-pay

## 2021-05-25 ENCOUNTER — Encounter: Payer: Self-pay | Admitting: General Practice

## 2021-05-25 ENCOUNTER — Ambulatory Visit (INDEPENDENT_AMBULATORY_CARE_PROVIDER_SITE_OTHER): Payer: Medicare Other | Admitting: General Practice

## 2021-05-25 VITALS — BP 130/60 | HR 78 | Ht 62.0 in | Wt 124.6 lb

## 2021-05-25 DIAGNOSIS — I10 Essential (primary) hypertension: Secondary | ICD-10-CM

## 2021-05-25 DIAGNOSIS — R0609 Other forms of dyspnea: Secondary | ICD-10-CM | POA: Diagnosis not present

## 2021-05-25 DIAGNOSIS — R002 Palpitations: Secondary | ICD-10-CM | POA: Diagnosis not present

## 2021-05-25 DIAGNOSIS — E782 Mixed hyperlipidemia: Secondary | ICD-10-CM

## 2021-05-25 DIAGNOSIS — E785 Hyperlipidemia, unspecified: Secondary | ICD-10-CM

## 2021-05-25 NOTE — Telephone Encounter (Signed)
Requested medications are due for refill today.  no  Requested medications are on the active medications list.  no  Last refill. 12/15/2020  Future visit scheduled.   yes  Notes to clinic.  Medication was discontinued 01/15/2021    Requested Prescriptions  Pending Prescriptions Disp Refills   losartan-hydrochlorothiazide (HYZAAR) 100-25 MG tablet [Pharmacy Med Name: LOSARTAN/HCTZ 100/25MG TABLETS] 90 tablet 1    Sig: TAKE 1 TABLET BY MOUTH DAILY     Cardiovascular: ARB + Diuretic Combos Passed - 05/25/2021  6:22 AM      Passed - K in normal range and within 180 days    Potassium  Date Value Ref Range Status  03/23/2021 4.8 3.5 - 5.2 mmol/L Final          Passed - Na in normal range and within 180 days    Sodium  Date Value Ref Range Status  03/23/2021 140 134 - 144 mmol/L Final          Passed - Cr in normal range and within 180 days    Creatinine, Ser  Date Value Ref Range Status  03/23/2021 0.91 0.57 - 1.00 mg/dL Final          Passed - eGFR is 10 or above and within 180 days    GFR calc Af Amer  Date Value Ref Range Status  06/02/2020 62 >59 mL/min/1.73 Final    Comment:    **In accordance with recommendations from the NKF-ASN Task force,**   [facility] is in the process of updating its eGFR calculation to the   2021 CKD-EPI creatinine equation that estimates kidney function   without a race variable.    GFR calc non Af Amer  Date Value Ref Range Status  06/02/2020 54 (L) >59 mL/min/1.73 Final   eGFR  Date Value Ref Range Status  03/23/2021 62 >59 mL/min/1.73 Final          Passed - Patient is not pregnant      Passed - Last BP in normal range    BP Readings from Last 1 Encounters:  05/25/21 130/60          Passed - Valid encounter within last 6 months    Recent Outpatient Visits           2 months ago Primary hypertension   [place], Vernia Buff, NP   2 months ago Essential hypertension   [place], Jarome Matin, RPH-CPP   4 months ago Essential hypertension   [place], Jarome Matin, RPH-CPP   5 months ago Primary hypertension   [place], [place] W, NP   5 months ago Essential hypertension   [place], Vernia Buff, NP       Future Appointments             In 4 weeks Gildardo Pounds, NP [place]            Signed Prescriptions Disp Refills   simvastatin (ZOCOR) 10 MG tablet 90 tablet 1    Sig: TAKE 1 TABLET(10 MG) BY MOUTH AT BEDTIME     Cardiovascular:  Antilipid - Statins Failed - 05/25/2021  6:22 AM      Failed - Lipid Panel in normal range within the last 12 months    Cholesterol, Total  Date Value Ref Range  Status  03/23/2021 172 100 - 199 mg/dL Final   LDL Chol Calc (NIH)  Date Value Ref Range Status  03/23/2021 94 0 - 99 mg/dL Final   HDL  Date Value Ref Range Status  03/23/2021 56 >39 mg/dL Final   Triglycerides  Date Value Ref Range Status  03/23/2021 125 0 - 149 mg/dL Final         Passed - Patient is not pregnant      Passed - Valid encounter within last 12 months    Recent Outpatient Visits           2 months ago Primary hypertension   [place], Vernia Buff, NP   2 months ago Essential hypertension   [place], Jarome Matin, RPH-CPP   4 months ago Essential hypertension   [place], Jarome Matin, RPH-CPP   5 months ago Primary hypertension   [place], Vernia Buff, NP   5 months ago Essential hypertension   [place], Vernia Buff, NP       Future Appointments             In 4 weeks Gildardo Pounds, NP [place]

## 2021-05-25 NOTE — Telephone Encounter (Signed)
Requested Prescriptions  Pending Prescriptions Disp Refills   losartan-hydrochlorothiazide (HYZAAR) 100-25 MG tablet [Pharmacy Med Name: LOSARTAN/HCTZ 100/25MG  TABLETS] 90 tablet 1    Sig: TAKE 1 TABLET BY MOUTH DAILY     Cardiovascular: ARB + Diuretic Combos Passed - 05/25/2021  6:22 AM      Passed - K in normal range and within 180 days    Potassium  Date Value Ref Range Status  03/23/2021 4.8 3.5 - 5.2 mmol/L Final         Passed - Na in normal range and within 180 days    Sodium  Date Value Ref Range Status  03/23/2021 140 134 - 144 mmol/L Final         Passed - Cr in normal range and within 180 days    Creatinine, Ser  Date Value Ref Range Status  03/23/2021 0.91 0.57 - 1.00 mg/dL Final         Passed - eGFR is 10 or above and within 180 days    GFR calc Af Amer  Date Value Ref Range Status  06/02/2020 62 >59 mL/min/1.73 Final    Comment:    **In accordance with recommendations from the NKF-ASN Task force,**   [facility] is in the process of updating its eGFR calculation to the   2021 CKD-EPI creatinine equation that estimates kidney function   without a race variable.    GFR calc non Af Amer  Date Value Ref Range Status  06/02/2020 54 (L) >59 mL/min/1.73 Final   eGFR  Date Value Ref Range Status  03/23/2021 62 >59 mL/min/1.73 Final         Passed - Patient is not pregnant      Passed - Last BP in normal range    BP Readings from Last 1 Encounters:  05/25/21 130/60         Passed - Valid encounter within last 6 months    Recent Outpatient Visits          2 months ago Primary hypertension   [place], Vernia Buff, NP   2 months ago Essential hypertension   [place], Jarome Matin, RPH-CPP   4 months ago Essential hypertension   [place], Jarome Matin, RPH-CPP   5 months ago Primary hypertension   [place], [place] W, NP   5 months ago Essential hypertension   [place], Vernia Buff, NP      Future Appointments            In 4 weeks Gildardo Pounds, NP [place]            simvastatin ([place]) 10 MG tablet [Pharmacy Med Name: SIMVASTATIN 10MG  TABLETS] 90 tablet 1    Sig: TAKE 1 TABLET(10 MG) BY MOUTH AT BEDTIME     Cardiovascular:  Antilipid - Statins Failed - 05/25/2021  6:22 AM      Failed - Lipid Panel in normal range within the last 12 months    Cholesterol, Total  Date Value Ref Range Status  03/23/2021 172 100 - 199 mg/dL Final   LDL Chol Calc (NIH)  Date Value Ref Range Status  03/23/2021 94 0 - 99 mg/dL Final   HDL  Date Value Ref Range Status  03/23/2021 56 >39 mg/dL Final   Triglycerides  Date Value Ref  Range Status  03/23/2021 125 0 - 149 mg/dL Final         Passed - Patient is not pregnant      Passed - Valid encounter within last 12 months    Recent Outpatient Visits          2 months ago Primary hypertension   [place], Vernia Buff, NP   2 months ago Essential hypertension   [place], Jarome Matin, RPH-CPP   4 months ago Essential hypertension   [place], Stephen L, RPH-CPP   5 months ago Primary hypertension   [place], Vernia Buff, NP   5 months ago Essential hypertension   [place], Vernia Buff, NP      Future Appointments            In 4 weeks Gildardo Pounds, NP [place]

## 2021-05-25 NOTE — Patient Instructions (Signed)
Medication Instructions:  Your physician recommends that you continue on your current medications as directed. Please refer to the Current Medication list given to you today.  *If you need a refill on your cardiac medications before your next appointment, please call your pharmacy*   Lab Work: NONE ordered at this time of appointment   Testing/Procedures: NONE ordered at this time of appointment   Follow-Up: At [facility], you and your health needs are our priority.  As part of our continuing mission to provide you with exceptional heart care, we have created designated Provider Care Teams.  These Care Teams include your primary Cardiologist (physician) and Advanced Practice Providers (APPs -  Physician Assistants and Nurse Practitioners) who all work together to provide you with the care you need, when you need it.  Your next appointment:   6-9 month(s)  The format for your next appointment:   In Person  Provider:   Quay Burow, MD  or Coletta Memos, FNP       Other Instructions Maintain Physical Activity

## 2021-06-22 ENCOUNTER — Other Ambulatory Visit: Payer: Self-pay | Admitting: Nurse Practitioner

## 2021-06-22 ENCOUNTER — Encounter: Payer: Self-pay | Admitting: Nurse Practitioner

## 2021-06-22 ENCOUNTER — Other Ambulatory Visit: Payer: Self-pay

## 2021-06-22 ENCOUNTER — Ambulatory Visit: Payer: Medicare Other | Attending: Nurse Practitioner | Admitting: Nurse Practitioner

## 2021-06-22 ENCOUNTER — Ambulatory Visit
Admission: RE | Admit: 2021-06-22 | Discharge: 2021-06-22 | Disposition: A | Discharge status: Home / Self Care | Payer: Medicare Other | Source: Ambulatory Visit | Attending: Nurse Practitioner | Admitting: Nurse Practitioner

## 2021-06-22 VITALS — BP 173/73 | HR 74 | Resp 18 | Ht 62.0 in | Wt 125.1 lb

## 2021-06-22 DIAGNOSIS — Z13820 Encounter for screening for osteoporosis: Secondary | ICD-10-CM

## 2021-06-22 DIAGNOSIS — G8929 Other chronic pain: Secondary | ICD-10-CM

## 2021-06-22 DIAGNOSIS — M791 Myalgia, unspecified site: Secondary | ICD-10-CM | POA: Diagnosis not present

## 2021-06-22 DIAGNOSIS — H409 Unspecified glaucoma: Secondary | ICD-10-CM | POA: Diagnosis not present

## 2021-06-22 DIAGNOSIS — E785 Hyperlipidemia, unspecified: Secondary | ICD-10-CM | POA: Insufficient documentation

## 2021-06-22 DIAGNOSIS — I10 Essential (primary) hypertension: Secondary | ICD-10-CM | POA: Diagnosis present

## 2021-06-22 DIAGNOSIS — H538 Other visual disturbances: Secondary | ICD-10-CM | POA: Insufficient documentation

## 2021-06-22 DIAGNOSIS — M545 Low back pain, unspecified: Secondary | ICD-10-CM | POA: Insufficient documentation

## 2021-06-22 DIAGNOSIS — Z780 Asymptomatic menopausal state: Secondary | ICD-10-CM

## 2021-06-22 DIAGNOSIS — Z713 Dietary counseling and surveillance: Secondary | ICD-10-CM | POA: Insufficient documentation

## 2021-06-22 DIAGNOSIS — M2550 Pain in unspecified joint: Secondary | ICD-10-CM | POA: Insufficient documentation

## 2021-06-22 DIAGNOSIS — Z79899 Other long term (current) drug therapy: Secondary | ICD-10-CM | POA: Insufficient documentation

## 2021-06-22 DIAGNOSIS — Z1231 Encounter for screening mammogram for malignant neoplasm of breast: Secondary | ICD-10-CM

## 2021-06-22 MED ORDER — DICLOFENAC SODIUM 1 % EX GEL: 2.0000 g | Freq: Four times a day (QID) | CUTANEOUS | 1 refills | Status: DC

## 2021-06-22 MED ORDER — AMLODIPINE BESYLATE 10 MG PO TABS: 10.0000 mg | ORAL_TABLET | Freq: Every day | ORAL | 0 refills | Status: DC

## 2021-06-22 MED ORDER — LATANOPROST 0.005 % OP SOLN: OPHTHALMIC | 1 refills | Status: AC

## 2021-06-22 MED ORDER — VALSARTAN 80 MG PO TABS
80.0000 mg | ORAL_TABLET | Freq: Every day | ORAL | 1 refills | Status: DC
Start: 2021-06-22 — End: 2021-09-28

## 2021-06-22 MED ORDER — LIDOCAINE 5 % EX PTCH: 1.0000 | MEDICATED_PATCH | CUTANEOUS | 0 refills | Status: DC

## 2021-06-22 MED ORDER — SIMVASTATIN 10 MG PO TABS: 10.0000 mg | ORAL_TABLET | Freq: Every day | ORAL | 3 refills | Status: DC

## 2021-06-22 NOTE — Progress Notes (Signed)
? ?Assessment & Plan:  ?Matha was seen today for hypertension. ? ?Diagnoses and all orders for this visit: ? ?Essential hypertension ?-     valsartan (DIOVAN) 80 MG tablet; Take 1 tablet (80 mg total) by mouth daily. ?-     amLODipine (NORVASC) 10 MG tablet; Take 1 tablet (10 mg total) by mouth daily. ?-     CMP14+EGFR ?Continue all antihypertensives as prescribed.  ?Remember to bring in your blood pressure log with you for your follow up appointment.  ?DASH/Mediterranean Diets are healthier choices for HTN.   ? ?Hyperlipidemia, unspecified hyperlipidemia type ?-     simvastatin (ZOCOR) 10 MG tablet; Take 1 tablet (10 mg total) by mouth daily at 6 PM. ?INSTRUCTIONS: Work on a low fat, heart healthy diet and participate in regular aerobic exercise program by working out at least 150 minutes per week; 5 days a week-30 minutes per day. Avoid red meat/beef/steak,  fried foods. junk foods, sodas, sugary drinks, unhealthy snacking, alcohol and smoking.  Drink at least 80 oz of water per day and monitor your carbohydrate intake daily.   ? ?Glaucoma of both eyes, unspecified glaucoma type ?-     latanoprost (XALATAN) 0.005 % ophthalmic solution; INSTILL 1 DROP IN BOTH EYES AT BEDTIME ? ?Encounter for osteoporosis screening in asymptomatic postmenopausal patient ?-     DG Bone Density; Future ?-     DG Lumbar Spine Complete; Future ? ?Breast cancer screening by mammogram ?-     MS DIGITAL SCREENING TOMO BILATERAL; Future ? ?Chronic bilateral low back pain without sciatica ?-     diclofenac Sodium (VOLTAREN) 1 % GEL; Apply 2 g topically 4 (four) times daily. ?-     lidocaine (LIDODERM) 5 %; Place 1 patch onto the skin daily. Remove & Discard patch within 12 hours or as directed by MD ? ? ? ?Patient has been counseled on age-appropriate routine health concerns for screening and prevention. These are reviewed and up-to-date. Referrals have been placed accordingly. Immunizations are up-to-date or declined.    ?Subjective:   ? ?Chief Complaint  ?Patient presents with  ? Hypertension  ? ? ?Jo Brown 88 y.o. female presents to office today for follow up to HTN. ?She has a past medical history of Hypertension.   ?VRI was used to communicate directly with patient for the entire encounter including providing detailed patient instructions.   ? ? ?Back Pain ?Chronic and not related to any injury or trauma. The voltaren gel that she was given makes her skin itch so she doesn't take that as often. A church member gave her a lidocaine patch and she is asking if it's okay to use it. She has chronic low back pain which is worse when rising from a prolonged sitting or lying position as well as bending backwards or forwards. Pain is every day. She denies any symptoms of sciatica.  ? ? ?HTN ?Slightly elevated today. She reports much lower readings at home. Will continue on valsartan 80 mg daily and amlodipine 10 mg daily as prescribed.  ?BP Readings from Last 3 Encounters:  ?06/22/21 (!) 173/73  ?05/25/21 130/60  ?03/23/21 136/72  ?  ? ? ?Review of Systems  ?Constitutional:  Negative for fever, malaise/fatigue and weight loss.  ?HENT: Negative.  Negative for nosebleeds.   ?Eyes:  Positive for blurred vision. Negative for double vision and photophobia.  ?Respiratory: Negative.  Negative for cough and shortness of breath.   ?Cardiovascular: Negative.  Negative for chest pain, palpitations  and leg swelling.  ?Gastrointestinal: Negative.  Negative for heartburn, nausea and vomiting.  ?Musculoskeletal:  Positive for back pain, joint pain and myalgias.  ?Neurological: Negative.  Negative for dizziness, focal weakness, seizures and headaches.  ?Psychiatric/Behavioral: Negative.  Negative for suicidal ideas.   ? ?Past Medical History:  ?Diagnosis Date  ? Hypertension   ? ? ?Past Surgical History:  ?Procedure Laterality Date  ? EYE SURGERY    ? removal of colon polyps    ? ? ?Family History  ?Problem Relation Age of Onset  ? Healthy Neg Hx   ? ? ?Social  History Reviewed with no changes to be made today.  ? ?Outpatient Medications Prior to Visit  ?Medication Sig Dispense Refill  ? amLODipine (NORVASC) 10 MG tablet TAKE 1 TABLET(10 MG) BY MOUTH DAILY 90 tablet 0  ? diclofenac Sodium (VOLTAREN) 1 % GEL Apply 2 g topically 4 (four) times daily. 400 g 1  ? latanoprost (XALATAN) 0.005 % ophthalmic solution INSTILL 1 DROP IN BOTH EYES AT BEDTIME 7.5 mL 1  ? simvastatin (ZOCOR) 10 MG tablet TAKE 1 TABLET(10 MG) BY MOUTH AT BEDTIME 90 tablet 1  ? valsartan (DIOVAN) 80 MG tablet TAKE 1 TABLET(80 MG) BY MOUTH DAILY 90 tablet 0  ? ?No facility-administered medications prior to visit.  ? ? ?No Known Allergies ? ?   ?Objective:  ?  ?BP (!) 173/73 (BP Location: Right Arm, Cuff Size: Small)   Pulse 74   Resp 18   Ht $R'5\' 2"'pe$  (1.575 m)   Wt 125 lb 2 oz (56.8 kg)   SpO2 100%   BMI 22.89 kg/m?  ?Wt Readings from Last 3 Encounters:  ?06/22/21 125 lb 2 oz (56.8 kg)  ?05/25/21 124 lb 9.6 oz (56.5 kg)  ?03/23/21 122 lb 6 oz (55.5 kg)  ? ? ?Physical Exam ?Vitals and nursing note reviewed.  ?Constitutional:   ?   Appearance: She is well-developed.  ?HENT:  ?   Head: Normocephalic and atraumatic.  ?Cardiovascular:  ?   Rate and Rhythm: Normal rate and regular rhythm.  ?   Heart sounds: Normal heart sounds. No murmur heard. ?  No friction rub. No gallop.  ?Pulmonary:  ?   Effort: Pulmonary effort is normal. No tachypnea or respiratory distress.  ?   Breath sounds: Normal breath sounds. No decreased breath sounds, wheezing, rhonchi or rales.  ?Chest:  ?   Chest wall: No tenderness.  ?Abdominal:  ?   General: Bowel sounds are normal.  ?   Palpations: Abdomen is soft.  ?Musculoskeletal:     ?   General: Normal range of motion.  ?   Cervical back: Normal range of motion.  ?Skin: ?   General: Skin is warm and dry.  ?Neurological:  ?   Mental Status: She is alert and oriented to person, place, and time.  ?   Coordination: Coordination normal.  ?Psychiatric:     ?   Behavior: Behavior normal.  Behavior is cooperative.     ?   Thought Content: Thought content normal.     ?   Judgment: Judgment normal.  ? ? ? ? ?   ?Patient has been counseled extensively about nutrition and exercise as well as the importance of adherence with medications and regular follow-up. The patient was given clear instructions to go to ER or return to medical center if symptoms don't improve, worsen or new problems develop. The patient verbalized understanding.  ? ?Follow-up: Return in about 3 months (around  09/22/2021).  ? ?Gildardo Pounds, FNP-BC ?[facility] ?[place], [place] ?[phone]   ?06/22/2021, 1:48 PM ?

## 2021-06-23 LAB — CMP14+EGFR
ALT: 16 IU/L (ref 0–32)
AST: 18 IU/L (ref 0–40)
Albumin/Globulin Ratio: 2 (ref 1.2–2.2)
Albumin: 4.6 g/dL (ref 3.6–4.6)
Alkaline Phosphatase: 116 IU/L (ref 44–121)
BUN/Creatinine Ratio: 29 — ABNORMAL HIGH (ref 12–28)
BUN: 24 mg/dL (ref 8–27)
Bilirubin Total: 0.3 mg/dL (ref 0.0–1.2)
CO2: 23 mmol/L (ref 20–29)
Calcium: 9.5 mg/dL (ref 8.7–10.3)
Chloride: 106 mmol/L (ref 96–106)
Creatinine, Ser: 0.84 mg/dL (ref 0.57–1.00)
Globulin, Total: 2.3 g/dL (ref 1.5–4.5)
Glucose: 97 mg/dL (ref 70–99)
Potassium: 4.7 mmol/L (ref 3.5–5.2)
Sodium: 142 mmol/L (ref 134–144)
Total Protein: 6.9 g/dL (ref 6.0–8.5)
eGFR: 68 mL/min/{1.73_m2} (ref >59)

## 2021-06-23 NOTE — Telephone Encounter (Signed)
Requested Prescriptions  ?Pending Prescriptions Disp Refills  ?? lidocaine (LIDODERM) 5 % [Pharmacy Med Name: LIDOCAINE 5% PATCH] 90 patch 0  ?  Sig: UNWRAP AND APPLY 1 PATCH TO SKIN DAILY. REMOVE AND DISCARD PATCH WITHIN 12 HOURS OR AS DIRECTED BY MD  ?  ? Analgesics:  Topicals Failed - 06/22/2021  9:33 AM  ?  ?  Failed - Manual Review: Labs are only required if the patient has taken medication for more than 8 weeks.  ?  ?  Passed - PLT in normal range and within 360 days  ?  Platelets  ?Date Value Ref Range Status  ?03/23/2021 320 150 - 450 x10E3/uL Final  ?   ?  ?  Passed - HGB in normal range and within 360 days  ?  Hemoglobin  ?Date Value Ref Range Status  ?03/23/2021 12.7 11.1 - 15.9 g/dL Final  ?   ?  ?  Passed - HCT in normal range and within 360 days  ?  Hematocrit  ?Date Value Ref Range Status  ?03/23/2021 37.9 34.0 - 46.6 % Final  ?   ?  ?  Passed - Cr in normal range and within 360 days  ?  Creatinine, Ser  ?Date Value Ref Range Status  ?06/22/2021 0.84 0.57 - 1.00 mg/dL Final  ?   ?  ?  Passed - eGFR is 30 or above and within 360 days  ?  GFR calc Af Amer  ?Date Value Ref Range Status  ?06/02/2020 62 >59 mL/min/1.73 Final  ?  Comment:  ?  **In accordance with recommendations from the NKF-ASN Task force,** ?  [facility] is in the process of updating its eGFR calculation to the ?  2021 CKD-EPI creatinine equation that estimates kidney function ?  without a race variable. ?  ? ?GFR calc non Af Amer  ?Date Value Ref Range Status  ?06/02/2020 54 (L) >59 mL/min/1.73 Final  ? ?eGFR  ?Date Value Ref Range Status  ?06/22/2021 68 >59 mL/min/1.73 Final  ?   ?  ?  Passed - Patient is not pregnant  ?  ?  Passed - Valid encounter within last 12 months  ?  Recent Outpatient Visits   ?      ? Yesterday Essential hypertension  ? [place], [place] W, NP  ? 3 months ago Primary hypertension  ? [place], [place] W, NP  ? 3 months ago Essential  hypertension  ? [place], RPH-CPP  ? 5 months ago Essential hypertension  ? [place], RPH-CPP  ? 6 months ago Primary hypertension  ? [place] Gildardo Pounds, NP  ?  ?  ?Future Appointments   ?        ? In 3 months Gildardo Pounds, NP [place]  ?  ? ?  ?  ?  ? ? ?

## 2021-06-28 ENCOUNTER — Other Ambulatory Visit: Payer: Self-pay | Admitting: Nurse Practitioner

## 2021-06-28 DIAGNOSIS — M545 Low back pain, unspecified: Secondary | ICD-10-CM

## 2021-06-28 DIAGNOSIS — G8929 Other chronic pain: Secondary | ICD-10-CM

## 2021-09-28 ENCOUNTER — Encounter: Payer: Self-pay | Admitting: Nurse Practitioner

## 2021-09-28 ENCOUNTER — Ambulatory Visit: Payer: Medicare Other | Attending: Nurse Practitioner | Admitting: Nurse Practitioner

## 2021-09-28 ENCOUNTER — Other Ambulatory Visit: Payer: Self-pay | Admitting: *Deleted

## 2021-09-28 VITALS — BP 138/75 | HR 77 | Temp 98.2°F | Resp 12 | Wt 124.0 lb

## 2021-09-28 DIAGNOSIS — G8929 Other chronic pain: Secondary | ICD-10-CM | POA: Diagnosis not present

## 2021-09-28 DIAGNOSIS — I10 Essential (primary) hypertension: Secondary | ICD-10-CM | POA: Insufficient documentation

## 2021-09-28 DIAGNOSIS — W010XXA Fall on same level from slipping, tripping and stumbling without subsequent striking against object, initial encounter: Secondary | ICD-10-CM | POA: Insufficient documentation

## 2021-09-28 DIAGNOSIS — M545 Low back pain, unspecified: Secondary | ICD-10-CM | POA: Insufficient documentation

## 2021-09-28 DIAGNOSIS — Z79899 Other long term (current) drug therapy: Secondary | ICD-10-CM | POA: Insufficient documentation

## 2021-09-28 DIAGNOSIS — E785 Hyperlipidemia, unspecified: Secondary | ICD-10-CM

## 2021-09-28 MED ORDER — AMLODIPINE BESYLATE 10 MG PO TABS: 10.0000 mg | ORAL_TABLET | Freq: Every day | ORAL | 1 refills | Status: DC

## 2021-09-28 MED ORDER — SIMVASTATIN 10 MG PO TABS: 10.0000 mg | ORAL_TABLET | Freq: Every day | ORAL | 0 refills | Status: DC

## 2021-09-28 MED ORDER — VALSARTAN 80 MG PO TABS: 80.0000 mg | ORAL_TABLET | Freq: Every day | ORAL | 1 refills | Status: DC

## 2021-09-28 MED ORDER — MELOXICAM 7.5 MG PO TABS
7.5000 mg | ORAL_TABLET | Freq: Every day | ORAL | 0 refills | Status: DC
Start: 2021-09-28 — End: 2022-07-12

## 2021-09-28 NOTE — Patient Instructions (Addendum)
Jo Brown #[id number]  [phone]    [facility] [address] [place], [place] [address] Phone [phone] Fax [phone]

## 2021-09-28 NOTE — Progress Notes (Signed)
Assessment & Plan:  Jo Brown was seen today for hypertension, back pain and fall.  Diagnoses and all orders for this visit:  Essential hypertension -     Basic metabolic panel  Chronic bilateral low back pain without sciatica -     Ambulatory referral to Physical Medicine Rehab    Patient has been counseled on age-appropriate routine health concerns for screening and prevention. These are reviewed and up-to-date. Referrals have been placed accordingly. Immunizations are up-to-date or declined.    Subjective:   Chief Complaint  Patient presents with   Hypertension   Back Pain   Fall   Hypertension Pertinent negatives include no blurred vision, chest pain, headaches, malaise/fatigue, palpitations or shortness of breath.  Back Pain Pertinent negatives include no chest pain, fever, headaches or weight loss.  Fall Pertinent negatives include no fever, headaches, nausea or vomiting.   Jo Brown 88 y.o. female presents to office today for follow up to HTN and chronic low back pain with abnormal xray.  VRI was used to communicate directly with patient for the entire encounter including providing detailed patient instructions.    HTN Well controlled.  She has a blood pressure log with her today. Average blood pressure readings 120-130/70s. She is currently taking amlodipine 10 mg at night and valsartan 80 mg daily.  BP Readings from Last 3 Encounters:  09/28/21 138/75  06/22/21 (!) 173/73  05/25/21 130/60    Chronic low back pain Chronic and not related to any injury or trauma. The voltaren gel that she was given makes her skin itch so she doesn't take that as often. She has chronic low back pain which is worse when rising from a prolonged sitting or lying position as well as bending backwards or forwards. Pain is every day. She denies any symptoms of sciatica. Lidoderm patch and voltaren gel ineffective.  Last fall 08-10-2021. She was trying to catch her dog and tripped.      Review of Systems  Constitutional:  Negative for fever, malaise/fatigue and weight loss.  HENT: Negative.  Negative for nosebleeds.   Eyes: Negative.  Negative for blurred vision, double vision and photophobia.  Respiratory: Negative.  Negative for cough and shortness of breath.   Cardiovascular: Negative.  Negative for chest pain, palpitations and leg swelling.  Gastrointestinal: Negative.  Negative for heartburn, nausea and vomiting.  Musculoskeletal:  Positive for back pain. Negative for myalgias.  Neurological: Negative.  Negative for dizziness, focal weakness, seizures and headaches.  Psychiatric/Behavioral: Negative.  Negative for suicidal ideas.     Past Medical History:  Diagnosis Date   Hypertension     Past Surgical History:  Procedure Laterality Date   EYE SURGERY     removal of colon polyps      Family History  Problem Relation Age of Onset   Healthy Neg Hx     Social History Reviewed with no changes to be made today.   Outpatient Medications Prior to Visit  Medication Sig Dispense Refill   amLODipine (NORVASC) 10 MG tablet Take 1 tablet (10 mg total) by mouth daily. 90 tablet 0   latanoprost (XALATAN) 0.005 % ophthalmic solution INSTILL 1 DROP IN BOTH EYES AT BEDTIME 7.5 mL 1   simvastatin (ZOCOR) 10 MG tablet Take 1 tablet (10 mg total) by mouth daily at 6 PM. 90 tablet 3   valsartan (DIOVAN) 80 MG tablet Take 1 tablet (80 mg total) by mouth daily. 90 tablet 1   diclofenac Sodium (VOLTAREN) 1 %  GEL Apply 2 g topically 4 (four) times daily. (Patient not taking: Reported on 09/28/2021) 400 g 1   lidocaine (LIDODERM) 5 % UNWRAP AND APPLY 1 PATCH TO SKIN DAILY. REMOVE AND DISCARD PATCH WITHIN 12 HOURS OR AS DIRECTED BY MD (Patient not taking: Reported on 09/28/2021) 90 patch 0   No facility-administered medications prior to visit.    No Known Allergies     Objective:    BP 138/75 (BP Location: Left Arm, Patient Position: Sitting, Cuff Size: Normal)   Pulse  77   Temp 98.2 F (36.8 C) (Oral)   Resp 12   Wt 124 lb (56.2 kg)   SpO2 98%   BMI 22.68 kg/m  Wt Readings from Last 3 Encounters:  09/28/21 124 lb (56.2 kg)  06/22/21 125 lb 2 oz (56.8 kg)  05/25/21 124 lb 9.6 oz (56.5 kg)    Physical Exam Vitals and nursing note reviewed.  Constitutional:      Appearance: She is well-developed.  HENT:     Head: Normocephalic and atraumatic.  Cardiovascular:     Rate and Rhythm: Normal rate and regular rhythm.     Heart sounds: Normal heart sounds. No murmur heard.    No friction rub. No gallop.  Pulmonary:     Effort: Pulmonary effort is normal. No tachypnea or respiratory distress.     Breath sounds: Normal breath sounds. No decreased breath sounds, wheezing, rhonchi or rales.  Chest:     Chest wall: No tenderness.  Abdominal:     General: Bowel sounds are normal.     Palpations: Abdomen is soft.  Musculoskeletal:        General: Normal range of motion.     Cervical back: Normal range of motion.  Skin:    General: Skin is warm and dry.  Neurological:     Mental Status: She is alert and oriented to person, place, and time.     Coordination: Coordination normal.  Psychiatric:        Behavior: Behavior normal. Behavior is cooperative.        Thought Content: Thought content normal.        Judgment: Judgment normal.          Patient has been counseled extensively about nutrition and exercise as well as the importance of adherence with medications and regular follow-up. The patient was given clear instructions to go to ER or return to medical center if symptoms don't improve, worsen or new problems develop. The patient verbalized understanding.   Follow-up: Return in about 3 months (around 12/29/2021).   Gildardo Pounds, FNP-BC [facility] [place], [place]   09/28/2021, 8:59 AM

## 2021-09-28 NOTE — Progress Notes (Signed)
Follow up back pain and HTN  Continuous back pain, with movement, 4/10 Falls x 2   [place] [id number]

## 2021-09-29 LAB — BASIC METABOLIC PANEL
BUN/Creatinine Ratio: 20 (ref 12–28)
BUN: 19 mg/dL (ref 8–27)
CO2: 23 mmol/L (ref 20–29)
Calcium: 9.6 mg/dL (ref 8.7–10.3)
Chloride: 107 mmol/L — ABNORMAL HIGH (ref 96–106)
Creatinine, Ser: 0.96 mg/dL (ref 0.57–1.00)
Glucose: 89 mg/dL (ref 70–99)
Potassium: 5.1 mmol/L (ref 3.5–5.2)
Sodium: 144 mmol/L (ref 134–144)
eGFR: 58 mL/min/{1.73_m2} — ABNORMAL LOW (ref >59)

## 2021-11-16 ENCOUNTER — Encounter: Payer: Self-pay | Admitting: Nurse Practitioner

## 2022-01-01 ENCOUNTER — Ambulatory Visit: Payer: Medicare Other | Admitting: Nurse Practitioner

## 2022-06-21 ENCOUNTER — Telehealth: Payer: Self-pay | Admitting: Nurse Practitioner

## 2022-06-21 NOTE — Telephone Encounter (Signed)
Called patient to schedule Medicare Annual Wellness Visit (AWV). Left message for patient to call back and schedule Medicare Annual Wellness Visit (AWV).  Last date of AWV:  due  awvi 04/20/15 per [facility]    If any questions, please contact me at [phone].  Thank you ,  [place] [employer] AWV direct phone # [phone]

## 2022-07-11 IMAGING — DX DG LUMBAR SPINE COMPLETE 4+V
5 series · 5 of 5 positions shown · non-contrast
Comparison: None.

CLINICAL DATA: Chronic bilateral low back pain radiating into both
legs.

EXAM:
LUMBAR SPINE - COMPLETE 4+ VIEW

[dg lumbar spine complete 4 +v (1 of 5)]
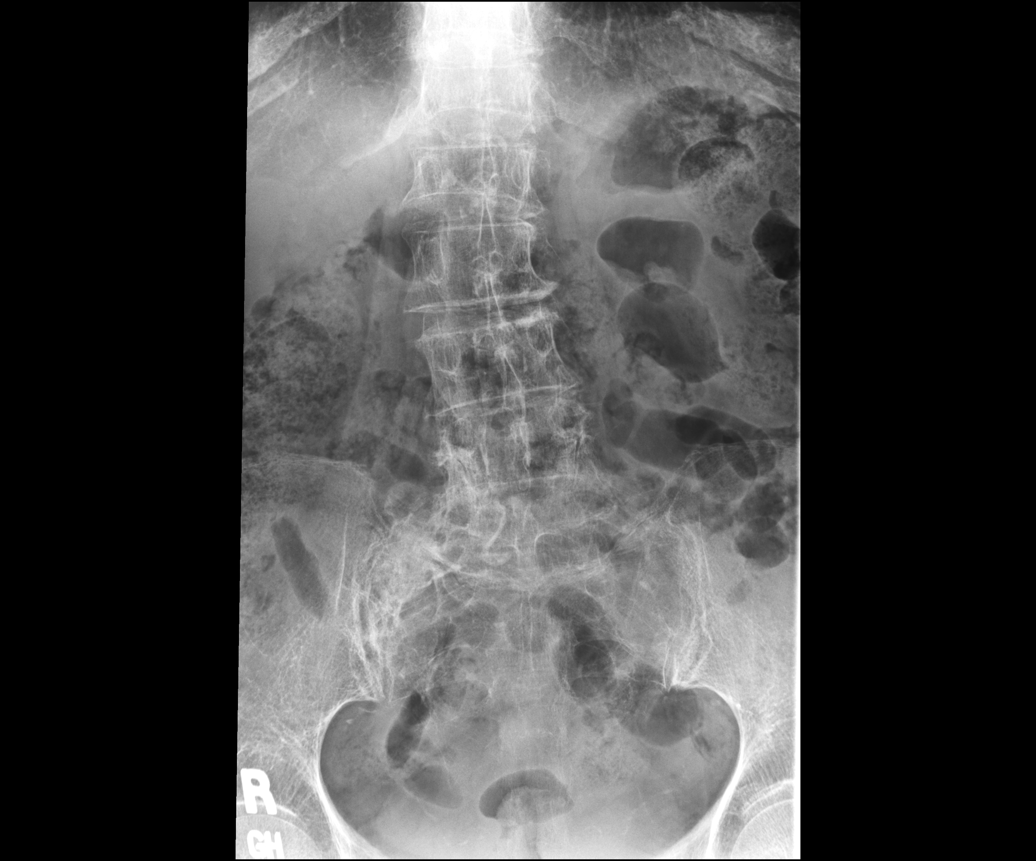

[dg lumbar spine complete 4 +v (2 of 5)]
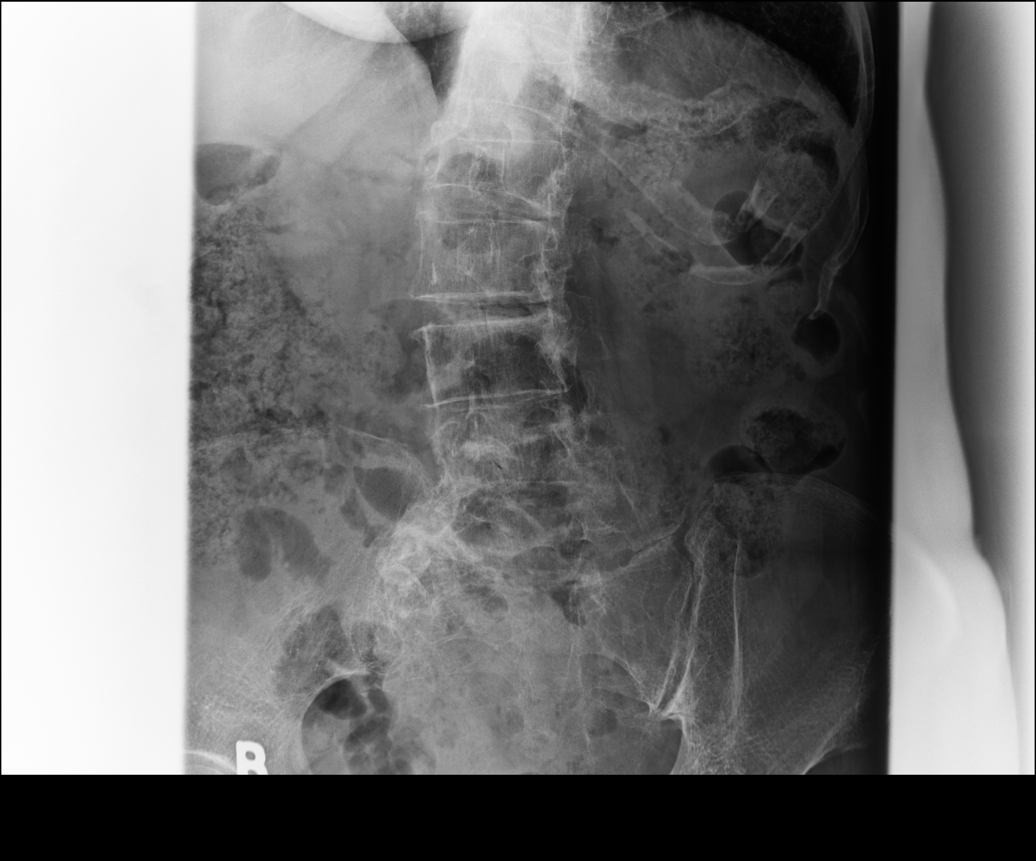

[dg lumbar spine complete 4 +v (3 of 5)]
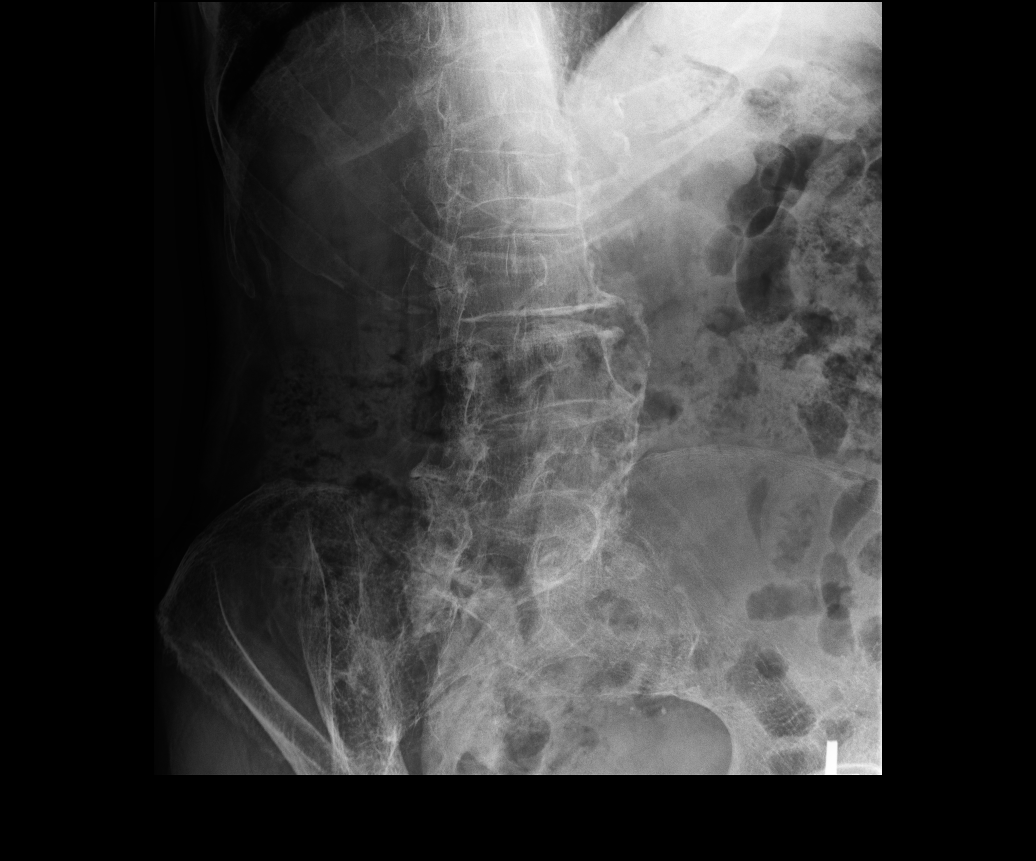

[dg lumbar spine complete 4 +v (4 of 5)]
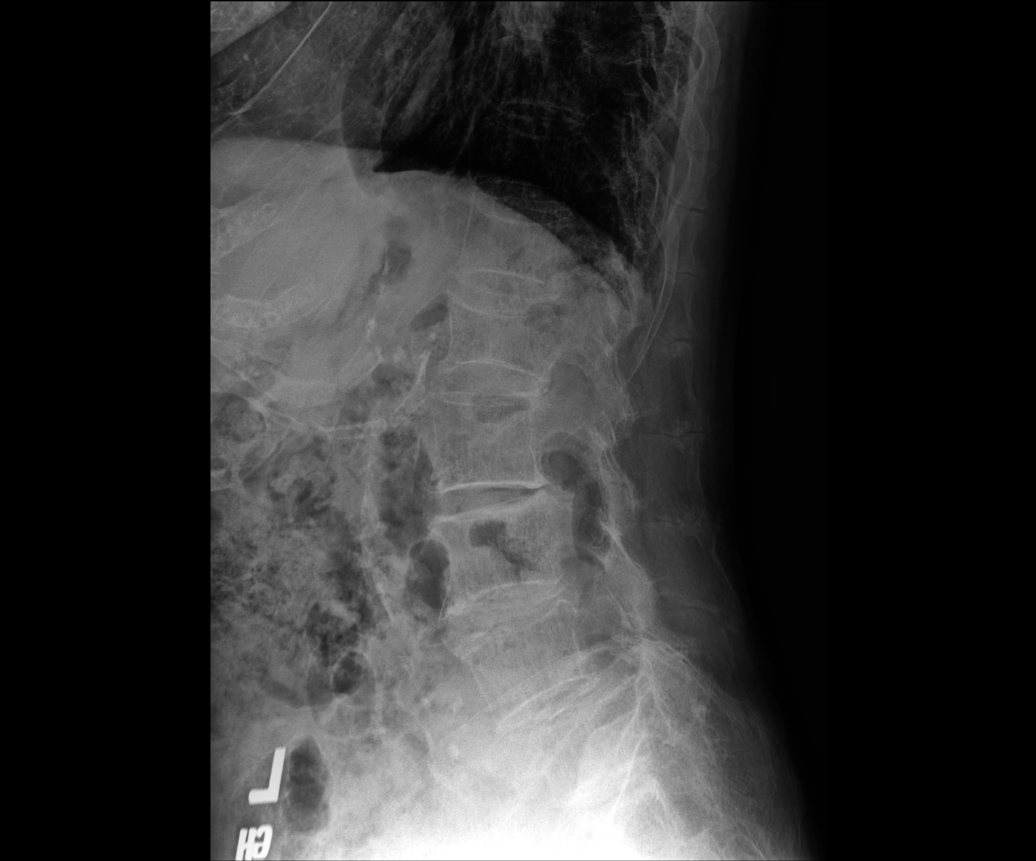

[dg lumbar spine complete 4 +v (5 of 5)]
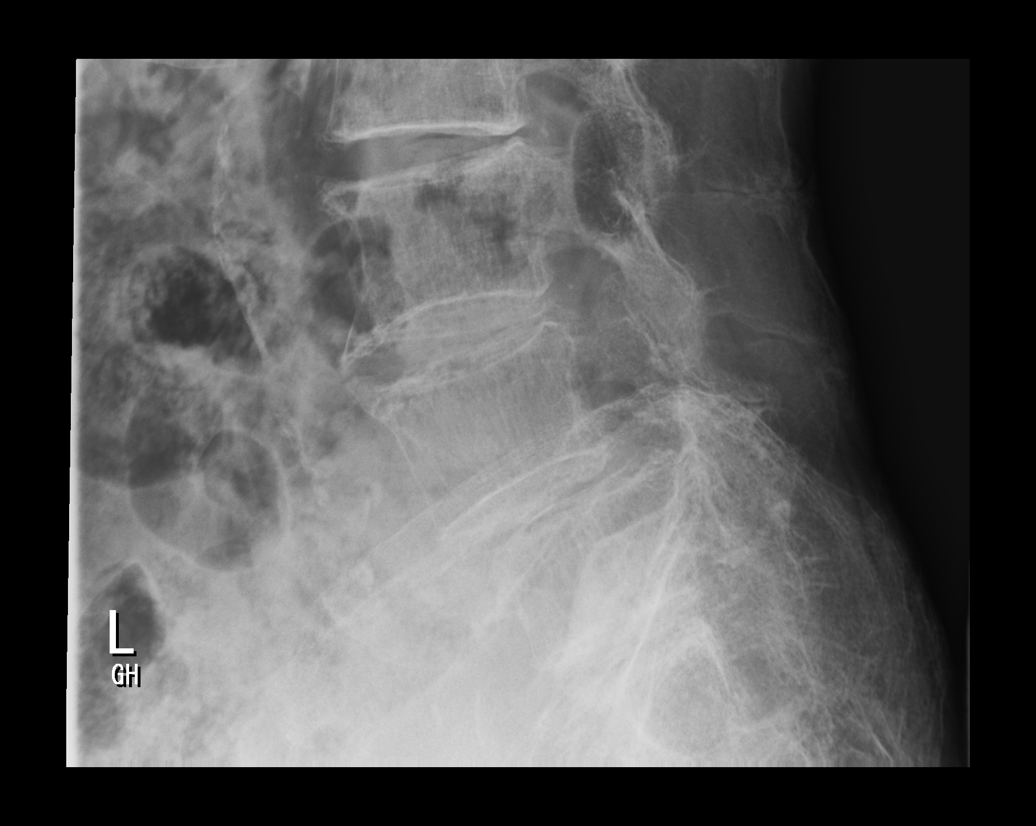

[5 of 5 positions shown; findings below may reference images not displayed]

FINDINGS: The bones are diffusely osteopenic. There is 9 mm of anterolisthesis
at L4-L5. Alignment is otherwise anatomic. There is mild-to-moderate
disc space narrowing throughout the lumbar spine compatible with
degenerative change. Endplate osteophytes are seen throughout. There
is mild dextroconvex curvature. There is mild vertebral body height
loss at T12 and L1 which appears chronic, but is age indeterminate.
Soft tissues are within normal limits.
IMPRESSION: 1. Mild vertebral body height loss at T12 and L1. Findings appear
chronic, but are age indeterminate given lack of comparisons.
Correlate for point tenderness to exclude acute fracture.
2. Diffuse osteopenia.
3. Moderate degenerative changes.
4. 9 mm of anterolisthesis at L4-L5.

## 2022-07-12 ENCOUNTER — Ambulatory Visit: Payer: Medicare Other | Attending: Nurse Practitioner | Admitting: Nurse Practitioner

## 2022-07-12 ENCOUNTER — Encounter: Payer: Self-pay | Admitting: Nurse Practitioner

## 2022-07-12 VITALS — BP 149/67 | HR 67 | Ht 62.0 in | Wt 132.0 lb

## 2022-07-12 DIAGNOSIS — Z780 Asymptomatic menopausal state: Secondary | ICD-10-CM | POA: Diagnosis not present

## 2022-07-12 DIAGNOSIS — Z79899 Other long term (current) drug therapy: Secondary | ICD-10-CM | POA: Diagnosis not present

## 2022-07-12 DIAGNOSIS — E785 Hyperlipidemia, unspecified: Secondary | ICD-10-CM

## 2022-07-12 DIAGNOSIS — E559 Vitamin D deficiency, unspecified: Secondary | ICD-10-CM | POA: Diagnosis not present

## 2022-07-12 DIAGNOSIS — I10 Essential (primary) hypertension: Secondary | ICD-10-CM | POA: Diagnosis present

## 2022-07-12 DIAGNOSIS — Z13820 Encounter for screening for osteoporosis: Secondary | ICD-10-CM

## 2022-07-12 DIAGNOSIS — M2550 Pain in unspecified joint: Secondary | ICD-10-CM

## 2022-07-12 MED ORDER — VALSARTAN 80 MG PO TABS: 80.0000 mg | ORAL_TABLET | Freq: Every day | ORAL | 1 refills | Status: DC

## 2022-07-12 MED ORDER — SIMVASTATIN 10 MG PO TABS: 10.0000 mg | ORAL_TABLET | Freq: Every day | ORAL | 3 refills | Status: DC

## 2022-07-12 MED ORDER — SIMVASTATIN 10 MG PO TABS: 10.0000 mg | ORAL_TABLET | Freq: Every day | ORAL | 0 refills | Status: DC

## 2022-07-12 MED ORDER — DICLOFENAC SODIUM 1 % EX GEL: 4.0000 g | Freq: Four times a day (QID) | CUTANEOUS | 3 refills | Status: DC

## 2022-07-12 MED ORDER — AMLODIPINE BESYLATE 5 MG PO TABS: 5.0000 mg | ORAL_TABLET | Freq: Every day | ORAL | 1 refills | Status: DC

## 2022-07-12 NOTE — Progress Notes (Signed)
Assessment & Plan:  Jo Brown was seen today for hypertension.  Diagnoses and all orders for this visit:  Primary hypertension -     valsartan (DIOVAN) 80 MG tablet; Take 1 tablet (80 mg total) by mouth daily. -     amLODipine (NORVASC) 5 MG tablet; Take 1 tablet (5 mg total) by mouth daily. -     CMP14+EGFR Continue all antihypertensives as prescribed.  Reminded to bring in blood pressure log for follow  up appointment.  RECOMMENDATIONS: DASH/Mediterranean Diets are healthier choices for HTN.     Hyperlipidemia, unspecified hyperlipidemia type -     Discontinue: simvastatin (ZOCOR) 10 MG tablet; Take 1 tablet (10 mg total) by mouth daily at 6 PM. -     simvastatin (ZOCOR) 10 MG tablet; Take 1 tablet (10 mg total) by mouth daily at 6 PM. INSTRUCTIONS: Work on a low fat, heart healthy diet and participate in regular aerobic exercise program by working out at least 150 minutes per week; 5 days a week-30 minutes per day. Avoid red meat/beef/steak,  fried foods. junk foods, sodas, sugary drinks, unhealthy snacking, alcohol and smoking.  Drink at least 80 oz of water per day and monitor your carbohydrate intake daily.    Encounter for osteoporosis screening in asymptomatic postmenopausal patient -     DG Bone Density; Future  Arthralgia of multiple joints -     diclofenac Sodium (VOLTAREN) 1 % GEL; Apply 4 g topically 4 (four) times daily. -     CBC with Differential  Dyslipidemia, goal LDL below 100 -     Lipid panel  Vitamin D deficiency -     VITAMIN D 25 Hydroxy (Vit-D Deficiency, Fractures)    Patient has been counseled on age-appropriate routine health concerns for screening and prevention. These are reviewed and up-to-date. Referrals have been placed accordingly. Immunizations are up-to-date or declined.    Subjective:   Chief Complaint  Patient presents with   Hypertension    Jo Brown 89 y.o. female presents to office today for follow up to HTN  VRI was used to  communicate directly with patient for the entire encounter including providing detailed patient instructions.    Blood pressure is slightly elevated today. She is currently prescribed amlodipine 10 mg daily and valsartan 80 mg daily. She reports normal BPs at home. Due to RLE edema we will decrease her amlodipine dosage and she will continue on valsartan 80 mg daily.  BP Readings from Last 3 Encounters:  07/12/22 (!) 164/70  09/28/21 138/75  06/22/21 (!) 173/73     Review of Systems  Constitutional:  Negative for fever, malaise/fatigue and weight loss.  HENT: Negative.  Negative for nosebleeds.   Eyes: Negative.  Negative for blurred vision, double vision and photophobia.  Respiratory: Negative.  Negative for cough and shortness of breath.   Cardiovascular: Negative.  Negative for chest pain, palpitations and leg swelling.  Gastrointestinal: Negative.  Negative for heartburn, nausea and vomiting.  Musculoskeletal:  Positive for joint pain. Negative for myalgias.  Neurological: Negative.  Negative for dizziness, focal weakness, seizures and headaches.  Psychiatric/Behavioral: Negative.  Negative for suicidal ideas.     Past Medical History:  Diagnosis Date   Hypertension     Past Surgical History:  Procedure Laterality Date   EYE SURGERY     removal of colon polyps      Family History  Problem Relation Age of Onset   Healthy Neg Hx     Social History  Reviewed with no changes to be made today.   Outpatient Medications Prior to Visit  Medication Sig Dispense Refill   latanoprost (XALATAN) 0.005 % ophthalmic solution INSTILL 1 DROP IN BOTH EYES AT BEDTIME 7.5 mL 1   amLODipine (NORVASC) 10 MG tablet Take 1 tablet (10 mg total) by mouth daily. 90 tablet 1   meloxicam (MOBIC) 7.5 MG tablet Take 1 tablet (7.5 mg total) by mouth daily. 30 tablet 0   simvastatin (ZOCOR) 10 MG tablet Take 1 tablet (10 mg total) by mouth daily at 6 PM. 90 tablet 0   valsartan (DIOVAN) 80 MG tablet  Take 1 tablet (80 mg total) by mouth daily. 90 tablet 1   No facility-administered medications prior to visit.    No Known Allergies     Objective:    BP (!) 164/70   Pulse 67   Ht 5\' 2"  (1.575 m)   Wt 132 lb (59.9 kg)   SpO2 99%   BMI 24.14 kg/m  Wt Readings from Last 3 Encounters:  07/12/22 132 lb (59.9 kg)  09/28/21 124 lb (56.2 kg)  06/22/21 125 lb 2 oz (56.8 kg)    Physical Exam Vitals and nursing note reviewed.  Constitutional:      Appearance: She is well-developed.  HENT:     Head: Normocephalic and atraumatic.  Cardiovascular:     Rate and Rhythm: Normal rate and regular rhythm.     Heart sounds: Normal heart sounds. No murmur heard.    No friction rub. No gallop.  Pulmonary:     Effort: Pulmonary effort is normal. No tachypnea or respiratory distress.     Breath sounds: Normal breath sounds. No decreased breath sounds, wheezing, rhonchi or rales.  Chest:     Chest wall: No tenderness.  Abdominal:     General: Bowel sounds are normal.     Palpations: Abdomen is soft.  Musculoskeletal:        General: Normal range of motion.     Cervical back: Normal range of motion.  Skin:    General: Skin is warm and dry.  Neurological:     Mental Status: She is alert and oriented to person, place, and time.     Coordination: Coordination normal.  Psychiatric:        Behavior: Behavior normal. Behavior is cooperative.        Thought Content: Thought content normal.        Judgment: Judgment normal.          Patient has been counseled extensively about nutrition and exercise as well as the importance of adherence with medications and regular follow-up. The patient was given clear instructions to go to ER or return to medical center if symptoms don't improve, worsen or new problems develop. The patient verbalized understanding.   Follow-up: Return for 4 weeks BP CHeck with LUKE see me in 3 months.   Gildardo Pounds, FNP-BC [facility]  [place], [place]   07/12/2022, 2:43 PM

## 2022-07-12 NOTE — Progress Notes (Signed)
Patient states while in [place] her left foot become swollen.  It was treated in [place], but is still slightly swollen.

## 2022-07-13 LAB — LIPID PANEL
Chol/HDL Ratio: 2.4 ratio (ref 0.0–4.4)
Cholesterol, Total: 140 mg/dL (ref 100–199)
HDL: 59 mg/dL (ref >39)
LDL Chol Calc (NIH): 59 mg/dL (ref 0–99)
Triglycerides: 126 mg/dL (ref 0–149)
VLDL Cholesterol Cal: 22 mg/dL (ref 5–40)

## 2022-07-13 LAB — CMP14+EGFR
ALT: 23 IU/L (ref 0–32)
AST: 29 IU/L (ref 0–40)
Albumin/Globulin Ratio: 1.8 (ref 1.2–2.2)
Albumin: 4.5 g/dL (ref 3.7–4.7)
Alkaline Phosphatase: 68 IU/L (ref 44–121)
BUN/Creatinine Ratio: 26 (ref 12–28)
BUN: 22 mg/dL (ref 8–27)
Bilirubin Total: 0.2 mg/dL (ref 0.0–1.2)
CO2: 23 mmol/L (ref 20–29)
Calcium: 9.7 mg/dL (ref 8.7–10.3)
Chloride: 108 mmol/L — ABNORMAL HIGH (ref 96–106)
Creatinine, Ser: 0.85 mg/dL (ref 0.57–1.00)
Globulin, Total: 2.5 g/dL (ref 1.5–4.5)
Glucose: 102 mg/dL — ABNORMAL HIGH (ref 70–99)
Potassium: 5 mmol/L (ref 3.5–5.2)
Sodium: 144 mmol/L (ref 134–144)
Total Protein: 7 g/dL (ref 6.0–8.5)
eGFR: 67 mL/min/{1.73_m2} (ref >59)

## 2022-07-13 LAB — CBC WITH DIFFERENTIAL/PLATELET
Basophils Absolute: 0 10*3/uL (ref 0.0–0.2)
Basos: 0 %
EOS (ABSOLUTE): 0.3 10*3/uL (ref 0.0–0.4)
Eos: 5 %
Hematocrit: 39.2 % (ref 34.0–46.6)
Hemoglobin: 13 g/dL (ref 11.1–15.9)
Immature Grans (Abs): 0 10*3/uL (ref 0.0–0.1)
Immature Granulocytes: 0 %
Lymphocytes Absolute: 2.4 10*3/uL (ref 0.7–3.1)
Lymphs: 35 %
MCH: 31 pg (ref 26.6–33.0)
MCHC: 33.2 g/dL (ref 31.5–35.7)
MCV: 94 fL (ref 79–97)
Monocytes Absolute: 0.4 10*3/uL (ref 0.1–0.9)
Monocytes: 6 %
Neutrophils Absolute: 3.6 10*3/uL (ref 1.4–7.0)
Neutrophils: 54 %
Platelets: 249 10*3/uL (ref 150–450)
RBC: 4.19 x10E6/uL (ref 3.77–5.28)
RDW: 13.2 % (ref 11.7–15.4)
WBC: 6.7 10*3/uL (ref 3.4–10.8)

## 2022-07-13 LAB — VITAMIN D 25 HYDROXY (VIT D DEFICIENCY, FRACTURES): Vit D, 25-Hydroxy: 38.9 ng/mL (ref 30.0–100.0)

## 2022-08-12 ENCOUNTER — Ambulatory Visit: Payer: Medicare Other | Admitting: Pharmacist

## 2022-08-19 ENCOUNTER — Ambulatory Visit: Payer: Medicare Other | Attending: Nurse Practitioner | Admitting: Pharmacist

## 2022-08-19 ENCOUNTER — Encounter: Payer: Self-pay | Admitting: Pharmacist

## 2022-08-19 VITALS — BP 119/66 | HR 90

## 2022-08-19 DIAGNOSIS — Z79899 Other long term (current) drug therapy: Secondary | ICD-10-CM | POA: Diagnosis not present

## 2022-08-19 DIAGNOSIS — I10 Essential (primary) hypertension: Secondary | ICD-10-CM | POA: Diagnosis present

## 2022-08-19 NOTE — Progress Notes (Signed)
S:    ID [id number] Jo Brown  No chief complaint on file.  89 y.o. female who presents for hypertension evaluation, education, and management.  PMH is significant for HTN.  Patient was referred and last seen by Primary Care Provider, Bertram Denver, on 07/12/2022. BP was 149/67 at that visit. Of note, her amlodipine dose had to be decreased d/t LE edema. Pt reported good blood pressure control at home.   Today, patient arrives in good spirits and presents without assistance. Denies dizziness, headache, blurred vision, swelling. Patient reports hypertension is longstanding. She brings in a log of her home blood pressures and has a validated home BP machine.   Family/Social history:  Fhx: no pertinent positives Tobacco: never smoker  Alcohol: none reported   Medication adherence reported. Patient has taken BP medications today.   Current antihypertensives include: amlodipine 5 mg daily, valsartan 80 mg daily   Antihypertensives tried in the past include: amlodipine 10 mg daily (edema), losartan-HCTZ (HA),   Reported home BP readings:  3/26: 128/64 mmHg 3/27: 129/69 mmHg 3/28: 133/70 mmHg 3/29: 119/69 mmHg  4/1: 113/63 mmHg 4/2: 110/61 mmHg 4/3: 104/57 mmHg 4/4: 134/74 mmHg 4/5: 109/65 mmHg 4/8: 112/66 mmHg 4/9: 113/62 mmHg 4/11: 126/70 mmHg 4/12: 117/63 mmHg 4/13: 116/64 mmHg 4/16: 112/65 mmHg 4/17: 115/67 mmHg 4/18: 115/62 mmHg 4/19: 120/69 mmHg 4/22: 132/78 mmHg 4/23: 126/71 mmHg 4/26: 120/63 mmHg 4/27: 126/67 mmHg  5/1: 121/71 mmHg  Avg: 119/66 mmHg    Patient reported dietary habits:  -Sodium: compliant with sodium restriction -Caffeine: only drinks 1 cup of coffee in the morning  Patient-reported exercise habits:  -very active outside and walks daily  O:  Vitals:   08/19/22 0937 08/19/22 1647  BP: (!) 147/73 119/66  Pulse: 90     Last 3 Office BP readings: BP Readings from Last 3 Encounters:  08/19/22 119/66  07/12/22 (!) 149/67  09/28/21 138/75     BMET    Component Value Date/Time   NA 144 07/12/2022 1454   K 5.0 07/12/2022 1454   CL 108 (H) 07/12/2022 1454   CO2 23 07/12/2022 1454   GLUCOSE 102 (H) 07/12/2022 1454   BUN 22 07/12/2022 1454   CREATININE 0.85 07/12/2022 1454   CALCIUM 9.7 07/12/2022 1454   GFRNONAA 54 (L) 06/02/2020 1123   GFRAA 62 06/02/2020 1123    Renal function: CrCl cannot be calculated (Patient's most recent lab result is older than the maximum 21 days allowed.).  Clinical ASCVD: No  The ASCVD Risk score (Arnett DK, et al., 2019) failed to calculate for the following reasons:   The 2019 ASCVD risk score is only valid for ages 60 to 9  Patient is participating in a Managed Medicaid Plan: No   A/P: Hypertension diagnosed currently controlled on current medications. BP goal < 130/80 mmHg. Medication adherence appears appropriate. Even though her clinic BP readings are consistently elevated, she has a BP log demonstrating goal BP readings at home. I have avg'd her BP readings over the last month and have entered this avg as a second BP reading for today's visit.  -Continued current regimen. -Counseled on lifestyle modifications for blood pressure control including reduced dietary sodium, increased exercise, adequate sleep. -Encouraged patient to check BP at home and bring log of readings to next visit. Counseled on proper use of home BP cuff.   Results reviewed and written information provided.    Written patient instructions provided. Patient verbalized understanding of treatment plan.  Total time in face  to face counseling 20 minutes.    Follow-up:  Pharmacist prn. PCP clinic visit in 10/13/2022.   Butch Penny, PharmD, Patsy Baltimore, CPP Clinical Pharmacist [facility] [phone]

## 2022-10-13 ENCOUNTER — Ambulatory Visit: Payer: Medicare Other | Attending: Nurse Practitioner | Admitting: Nurse Practitioner

## 2022-10-13 ENCOUNTER — Encounter: Payer: Self-pay | Admitting: Nurse Practitioner

## 2022-10-13 VITALS — BP 138/84 | HR 81 | Ht 62.0 in | Wt 130.0 lb

## 2022-10-13 DIAGNOSIS — I10 Essential (primary) hypertension: Secondary | ICD-10-CM

## 2022-10-13 DIAGNOSIS — L603 Nail dystrophy: Secondary | ICD-10-CM | POA: Diagnosis not present

## 2022-10-13 DIAGNOSIS — Z0000 Encounter for general adult medical examination without abnormal findings: Secondary | ICD-10-CM | POA: Diagnosis not present

## 2022-10-13 NOTE — Progress Notes (Signed)
Assessment & Plan:  Jo Brown was seen today for hypertension.  Diagnoses and all orders for this visit:  Encounter for annual physical exam  Primary hypertension -     CMP14+EGFR  Dystrophic nail -     Ambulatory referral to Podiatry    Patient has been counseled on age-appropriate routine health concerns for screening and prevention. These are reviewed and up-to-date. Referrals have been placed accordingly. Immunizations are up-to-date or declined.    Subjective:   Chief Complaint  Patient presents with   Hypertension   Hypertension Pertinent negatives include no blurred vision, chest pain, headaches, malaise/fatigue, palpitations or shortness of breath.   Jo Brown 89 y.o. female presents to office today for annual physical exam.   VRI was used to communicate directly with patient for the entire encounter including providing detailed patient instructions.    Currently prescribed valsartan 80 mg daily and amlodipine 5 mg daily. Blood pressure reading is high here but home readings 120-130/60-70s. BP Readings from Last 3 Encounters:  10/13/22 (!) 176/90  08/19/22 119/66  07/12/22 (!) 149/67    She has severe dystrophic left big toenail with moderate onychomycosis of several toenails. She states the left big toenail will sometimes bleed around the nail bed and has tenderness when manipulated. There are no obvious signs of bacterial infection or ingrown toenail on exam today.    Review of Systems  Constitutional:  Negative for fever, malaise/fatigue and weight loss.  HENT: Negative.  Negative for nosebleeds.   Eyes: Negative.  Negative for blurred vision, double vision and photophobia.  Respiratory: Negative.  Negative for cough and shortness of breath.   Cardiovascular: Negative.  Negative for chest pain, palpitations and leg swelling.  Gastrointestinal: Negative.  Negative for heartburn, nausea and vomiting.  Genitourinary: Negative.   Musculoskeletal: Negative.   Negative for myalgias.  Skin:        SEE HPI  Neurological: Negative.  Negative for dizziness, focal weakness, seizures and headaches.  Endo/Heme/Allergies: Negative.   Psychiatric/Behavioral: Negative.  Negative for suicidal ideas.     Past Medical History:  Diagnosis Date   Hypertension     Past Surgical History:  Procedure Laterality Date   EYE SURGERY     removal of colon polyps      Family History  Problem Relation Age of Onset   Healthy Neg Hx     Social History Reviewed with no changes to be made today.   Outpatient Medications Prior to Visit  Medication Sig Dispense Refill   amLODipine (NORVASC) 5 MG tablet Take 1 tablet (5 mg total) by mouth daily. 90 tablet 1   diclofenac Sodium (VOLTAREN) 1 % GEL Apply 4 g topically 4 (four) times daily. 200 g 3   latanoprost (XALATAN) 0.005 % ophthalmic solution INSTILL 1 DROP IN BOTH EYES AT BEDTIME 7.5 mL 1   simvastatin (ZOCOR) 10 MG tablet Take 1 tablet (10 mg total) by mouth daily at 6 PM. 90 tablet 3   valsartan (DIOVAN) 80 MG tablet Take 1 tablet (80 mg total) by mouth daily. 90 tablet 1   No facility-administered medications prior to visit.    No Known Allergies     Objective:    BP (!) 176/90 (BP Location: Left Arm, Patient Position: Sitting, Cuff Size: Normal)   Pulse 81   Ht 5\' 2"  (1.575 m)   Wt 130 lb (59 kg)   SpO2 100%   BMI 23.78 kg/m  Wt Readings from Last 3 Encounters:  10/13/22  130 lb (59 kg)  07/12/22 132 lb (59.9 kg)  09/28/21 124 lb (56.2 kg)    Physical Exam Constitutional:      Appearance: She is well-developed.  HENT:     Head: Normocephalic and atraumatic.     Right Ear: Hearing, tympanic membrane, ear canal and external ear normal.     Left Ear: Hearing, tympanic membrane, ear canal and external ear normal.     Nose: Nose normal.     Right Turbinates: Not enlarged.     Left Turbinates: Not enlarged.     Mouth/Throat:     Lips: Pink.     Mouth: Mucous membranes are moist.      Dentition: No dental tenderness, gingival swelling, dental abscesses or gum lesions.     Pharynx: No oropharyngeal exudate.  Eyes:     General: No scleral icterus.       Right eye: No discharge.     Extraocular Movements: Extraocular movements intact.     Conjunctiva/sclera: Conjunctivae normal.     Pupils: Pupils are equal, round, and reactive to light.  Neck:     Thyroid: No thyromegaly.     Trachea: No tracheal deviation.  Cardiovascular:     Rate and Rhythm: Normal rate and regular rhythm.     Heart sounds: Normal heart sounds. No murmur heard.    No friction rub.  Pulmonary:     Effort: Pulmonary effort is normal. No accessory muscle usage or respiratory distress.     Breath sounds: Normal breath sounds. No decreased breath sounds, wheezing, rhonchi or rales.  Abdominal:     General: Bowel sounds are normal. There is no distension.     Palpations: Abdomen is soft. There is no mass.     Tenderness: There is no abdominal tenderness. There is no right CVA tenderness, left CVA tenderness, guarding or rebound.     Hernia: No hernia is present.  Musculoskeletal:        General: No tenderness or deformity. Normal range of motion.     Cervical back: Normal range of motion and neck supple.  Feet:     Right foot:     Protective Sensation: 10 sites tested.  10 sites sensed.     Skin integrity: No ulcer, blister, skin breakdown or erythema.     Toenail Condition: Fungal disease present.    Left foot:     Protective Sensation: 10 sites tested.  10 sites sensed.     Skin integrity: No ulcer, blister, skin breakdown or erythema.     Toenail Condition: Left toenails are abnormally thick. Fungal disease present. Lymphadenopathy:     Cervical: No cervical adenopathy.  Skin:    General: Skin is warm and dry.     Findings: No erythema.  Neurological:     Mental Status: She is alert and oriented to person, place, and time.     Cranial Nerves: No cranial nerve deficit.     Motor: Motor  function is intact.     Coordination: Coordination is intact. Coordination normal.     Gait: Gait is intact.     Deep Tendon Reflexes:     Reflex Scores:      Patellar reflexes are 1+ on the right side and 1+ on the left side. Psychiatric:        Attention and Perception: Attention normal.        Mood and Affect: Mood normal.        Speech: Speech normal.  Behavior: Behavior normal.        Thought Content: Thought content normal.        Judgment: Judgment normal.          Patient has been counseled extensively about nutrition and exercise as well as the importance of adherence with medications and regular follow-up. The patient was given clear instructions to go to ER or return to medical center if symptoms don't improve, worsen or new problems develop. The patient verbalized understanding.   Follow-up: Return in about 3 months (around 01/13/2023).   Claiborne Rigg, FNP-BC [facility] [place], [place] [phone]   10/13/2022, 9:05 AM

## 2022-10-14 LAB — CMP14+EGFR
ALT: 14 IU/L (ref 0–32)
AST: 22 IU/L (ref 0–40)
Albumin: 4.4 g/dL (ref 3.7–4.7)
Alkaline Phosphatase: 118 IU/L (ref 44–121)
BUN/Creatinine Ratio: 20 (ref 12–28)
BUN: 18 mg/dL (ref 8–27)
Bilirubin Total: 0.3 mg/dL (ref 0.0–1.2)
CO2: 21 mmol/L (ref 20–29)
Calcium: 9.1 mg/dL (ref 8.7–10.3)
Chloride: 105 mmol/L (ref 96–106)
Creatinine, Ser: 0.88 mg/dL (ref 0.57–1.00)
Globulin, Total: 2.2 g/dL (ref 1.5–4.5)
Glucose: 93 mg/dL (ref 70–99)
Potassium: 4.6 mmol/L (ref 3.5–5.2)
Sodium: 142 mmol/L (ref 134–144)
Total Protein: 6.6 g/dL (ref 6.0–8.5)
eGFR: 64 mL/min/{1.73_m2} (ref >59)

## 2022-11-24 ENCOUNTER — Ambulatory Visit (INDEPENDENT_AMBULATORY_CARE_PROVIDER_SITE_OTHER): Payer: Medicare Other | Admitting: Podiatry

## 2022-11-24 DIAGNOSIS — L603 Nail dystrophy: Secondary | ICD-10-CM | POA: Diagnosis not present

## 2022-11-24 NOTE — Progress Notes (Signed)
  Subjective:  Patient ID: Jo Brown, female    DOB: [birth year],  MRN: [id number]  Chief Complaint  Patient presents with   Nail Problem    89 y.o. female presents with the above complaint.  Patient presents with complaint of left hallux nail dystrophy painful to touch  Gotten worse worse with ambulation and pressure.  She does not want to have removed she just wants to nail debridement she has not seen a respiratory me denies any other acute complaints close to 2 out of 10 dull achy in nature   Review of Systems: Negative except as noted in the HPI. Denies N/V/F/Ch.  Past Medical History:  Diagnosis Date   Hypertension     Current Outpatient Medications:    amLODipine (NORVASC) 5 MG tablet, Take 1 tablet (5 mg total) by mouth daily., Disp: 90 tablet, Rfl: 1   diclofenac Sodium (VOLTAREN) 1 % GEL, Apply 4 g topically 4 (four) times daily., Disp: 200 g, Rfl: 3   latanoprost (XALATAN) 0.005 % ophthalmic solution, INSTILL 1 DROP IN BOTH EYES AT BEDTIME, Disp: 7.5 mL, Rfl: 1   simvastatin (ZOCOR) 10 MG tablet, Take 1 tablet (10 mg total) by mouth daily at 6 PM., Disp: 90 tablet, Rfl: 3   valsartan (DIOVAN) 80 MG tablet, Take 1 tablet (80 mg total) by mouth daily., Disp: 90 tablet, Rfl: 1  Social History   Tobacco Use  Smoking Status Never  Smokeless Tobacco Never    No Known Allergies Objective:  There were no vitals filed for this visit. There is no height or weight on file to calculate BMI. Constitutional Well developed. Well nourished.  Vascular Dorsalis pedis pulses palpable bilaterally. Posterior tibial pulses palpable bilaterally. Capillary refill normal to all digits.  No cyanosis or clubbing noted. Pedal hair growth normal.  Neurologic Normal speech. Oriented to person, place, and time. Epicritic sensation to light touch grossly present bilaterally.  Dermatologic Nails left hallux nail dystrophy mild pain on palpation Skin within normal limits  Orthopedic:  Normal joint ROM without pain or crepitus bilaterally. No visible deformities. No bony tenderness.   Radiographs: None Assessment:   1. Nail dystrophy    Plan:  Patient was evaluated and treated and all questions answered.  Left hallux nail dystrophy -All questions and concerns were discussed with the patient extensive detail -Patient will benefit from nail debridement of the L1 nail.  Which was done in standard technique no complication noted no bleeding noted. -I discussed eventual removal of the nail if it continues to be bothersome for now it is not bothering her and she would like to hold off on it.  No follow-ups on file.

## 2023-01-19 ENCOUNTER — Ambulatory Visit: Payer: Medicare Other | Admitting: Nurse Practitioner

## 2023-01-27 ENCOUNTER — Other Ambulatory Visit: Payer: Self-pay | Admitting: Nurse Practitioner

## 2023-01-27 DIAGNOSIS — I10 Essential (primary) hypertension: Secondary | ICD-10-CM

## 2023-01-27 MED ORDER — AMLODIPINE BESYLATE 5 MG PO TABS: 5.0000 mg | ORAL_TABLET | Freq: Every day | ORAL | 0 refills | Status: DC

## 2023-01-27 NOTE — Telephone Encounter (Signed)
Medication Refill - Medication: amLODipine (NORVASC) 5 MG tablet   Pt has been out of the medication for two days. Please advise.   Has the patient contacted their pharmacy? No. No more refills.  (Agent: If no, request that the patient contact the pharmacy for the refill. If patient does not wish to contact the pharmacy document the reason why and proceed with request.)   Preferred Pharmacy (with phone number or street name):  [id number] [place], [place] - [address] AT [facility] [address] & [address]  [address] [place] [address]  Phone: [phone] Fax: [phone]  Hours: Not open 24 hours   Has the patient been seen for an appointment in the last year OR does the patient have an upcoming appointment? Yes.    Agent: Please be advised that RX refills may take up to 3 business days. We ask that you follow-up with your pharmacy.

## 2023-01-27 NOTE — Telephone Encounter (Signed)
Patient requesting refill. Future visit in 3 weeks.  Requested Prescriptions  Pending Prescriptions Disp Refills   amLODipine (NORVASC) 5 MG tablet 90 tablet 0    Sig: Take 1 tablet (5 mg total) by mouth daily.     Cardiovascular: Calcium Channel Blockers 2 Passed - 01/27/2023  1:46 PM      Passed - Last BP in normal range    BP Readings from Last 1 Encounters:  10/13/22 138/84         Passed - Last Heart Rate in normal range    Pulse Readings from Last 1 Encounters:  10/13/22 81         Passed - Valid encounter within last 6 months    Recent Outpatient Visits           3 months ago Encounter for annual physical exam   [facility] [place], Shea Stakes, NP   5 months ago Primary hypertension   [facility] [place], Cornelius Moras, RPH-CPP   6 months ago Primary hypertension   [facility] [place], Shea Stakes, NP   1 year ago Essential hypertension   [facility] [place], Shea Stakes, NP   1 year ago Essential hypertension   [facility] [place], Shea Stakes, NP       Future Appointments             In 3 weeks Claiborne Rigg, NP [facility]

## 2023-02-21 ENCOUNTER — Encounter: Payer: Self-pay | Admitting: Nurse Practitioner

## 2023-02-21 ENCOUNTER — Ambulatory Visit: Payer: Medicare Other | Attending: Nurse Practitioner | Admitting: Nurse Practitioner

## 2023-02-21 VITALS — BP 180/72 | HR 71 | Ht 62.0 in | Wt 127.0 lb

## 2023-02-21 DIAGNOSIS — Z79899 Other long term (current) drug therapy: Secondary | ICD-10-CM | POA: Diagnosis not present

## 2023-02-21 DIAGNOSIS — M2550 Pain in unspecified joint: Secondary | ICD-10-CM | POA: Insufficient documentation

## 2023-02-21 DIAGNOSIS — G8929 Other chronic pain: Secondary | ICD-10-CM | POA: Diagnosis not present

## 2023-02-21 DIAGNOSIS — E559 Vitamin D deficiency, unspecified: Secondary | ICD-10-CM | POA: Diagnosis not present

## 2023-02-21 DIAGNOSIS — I10 Essential (primary) hypertension: Secondary | ICD-10-CM | POA: Insufficient documentation

## 2023-02-21 DIAGNOSIS — Z23 Encounter for immunization: Secondary | ICD-10-CM | POA: Insufficient documentation

## 2023-02-21 DIAGNOSIS — R519 Headache, unspecified: Secondary | ICD-10-CM | POA: Diagnosis present

## 2023-02-21 DIAGNOSIS — R202 Paresthesia of skin: Secondary | ICD-10-CM | POA: Diagnosis present

## 2023-02-21 DIAGNOSIS — R002 Palpitations: Secondary | ICD-10-CM | POA: Diagnosis present

## 2023-02-21 MED ORDER — VALSARTAN 80 MG PO TABS: 80.0000 mg | ORAL_TABLET | Freq: Every day | ORAL | 1 refills | Status: DC

## 2023-02-21 MED ORDER — DICLOFENAC SODIUM 1 % EX GEL: 4.0000 g | Freq: Four times a day (QID) | CUTANEOUS | 3 refills | Status: DC

## 2023-02-21 MED ORDER — AMLODIPINE BESYLATE 5 MG PO TABS: 5.0000 mg | ORAL_TABLET | Freq: Every day | ORAL | 1 refills | Status: DC

## 2023-02-21 NOTE — Progress Notes (Signed)
Assessment & Plan:  Jo Brown was seen today for medical management of chronic issues.  Diagnoses and all orders for this visit:  Primary hypertension -     valsartan (DIOVAN) 80 MG tablet; Take 1 tablet (80 mg total) by mouth daily. -     amLODipine (NORVASC) 5 MG tablet; Take 1 tablet (5 mg total) by mouth daily. Continue all antihypertensives as prescribed.  Reminded to bring in blood pressure log for follow  up appointment.  RECOMMENDATIONS: DASH/Mediterranean Diets are healthier choices for HTN.    Arthralgia of multiple joints -     diclofenac Sodium (VOLTAREN) 1 % GEL; Apply 4 g topically 4 (four) times daily. -     CBC with Differential  Chronic right-sided headaches -     CMP14+EGFR -     CBC with Differential  Vitamin D deficiency disease -     VITAMIN D 25 Hydroxy (Vit-D Deficiency, Fractures)    Patient has been counseled on age-appropriate routine health concerns for screening and prevention. These are reviewed and up-to-date. Referrals have been placed accordingly. Immunizations are up-to-date or declined.    Subjective:   Chief Complaint  Patient presents with   Medical Management of Chronic Issues    Jo Brown 89 y.o. female presents to office today for follow up to HTN  VRI was used to communicate directly with patient for the entire encounter including providing detailed patient instructions.   HTN Blood pressures via home log are averaging 110/60-70s. She will bring BP device for next visit for correlation BP Readings from Last 3 Encounters:  02/21/23 (!) 180/72  10/13/22 138/84  08/19/22 119/66     Notes tingling on the left side of her back. Describes pain as an electric shock. Onset 1 Month ago. Not present today. Sensation comes and goes. Lasts a few seconds and goes away on its own.   Endorses pain in both knees. Worse with prolonged walking or sitting.    Has chronic right sided headaches.  Describes headache pain as pins-and-needles  sensation and only lasting a few seconds then going away on its own.  Palpitations-continues to notice intermittent periods of brief palpitations.  She has seen cardiology for this in the past and had a event monitor.  No significant findings.  She has been instructed to follow back up with cardiology for palpitations at this time.  No abnormal heart sounds noted on exam today.    Review of Systems  Constitutional:  Negative for fever, malaise/fatigue and weight loss.  HENT: Negative.  Negative for nosebleeds.   Eyes: Negative.  Negative for blurred vision, double vision and photophobia.  Respiratory: Negative.  Negative for cough and shortness of breath.   Cardiovascular:  Positive for palpitations. Negative for chest pain and leg swelling.  Gastrointestinal: Negative.  Negative for heartburn, nausea and vomiting.  Musculoskeletal:  Positive for joint pain. Negative for myalgias.  Neurological:  Positive for headaches. Negative for dizziness, focal weakness and seizures.  Psychiatric/Behavioral: Negative.  Negative for suicidal ideas.       Past Medical History:  Diagnosis Date   Hypertension     Past Surgical History:  Procedure Laterality Date   EYE SURGERY     removal of colon polyps      Family History  Problem Relation Age of Onset   Healthy Neg Hx     Social History Reviewed with no changes to be made today.   Outpatient Medications Prior to Visit  Medication Sig Dispense  Refill   latanoprost (XALATAN) 0.005 % ophthalmic solution INSTILL 1 DROP IN BOTH EYES AT BEDTIME 7.5 mL 1   simvastatin (ZOCOR) 10 MG tablet Take 1 tablet (10 mg total) by mouth daily at 6 PM. 90 tablet 3   amLODipine (NORVASC) 5 MG tablet Take 1 tablet (5 mg total) by mouth daily. 90 tablet 0   valsartan (DIOVAN) 80 MG tablet Take 1 tablet (80 mg total) by mouth daily. 90 tablet 1   diclofenac Sodium (VOLTAREN) 1 % GEL Apply 4 g topically 4 (four) times daily. (Patient not taking: Reported on  02/21/2023) 200 g 3   No facility-administered medications prior to visit.    No Known Allergies     Objective:    BP (!) 180/72   Pulse 71   Ht 5\' 2"  (1.575 m)   Wt 127 lb (57.6 kg)   SpO2 100%   BMI 23.23 kg/m  Wt Readings from Last 3 Encounters:  02/21/23 127 lb (57.6 kg)  10/13/22 130 lb (59 kg)  07/12/22 132 lb (59.9 kg)    Physical Exam Vitals and nursing note reviewed.  Constitutional:      Appearance: She is well-developed.  HENT:     Head: Normocephalic and atraumatic.  Cardiovascular:     Rate and Rhythm: Normal rate and regular rhythm.     Heart sounds: Normal heart sounds. No murmur heard.    No friction rub. No gallop.  Pulmonary:     Effort: Pulmonary effort is normal. No tachypnea or respiratory distress.     Breath sounds: Normal breath sounds. No decreased breath sounds, wheezing, rhonchi or rales.  Chest:     Chest wall: No tenderness.  Abdominal:     General: Bowel sounds are normal.     Palpations: Abdomen is soft.  Musculoskeletal:        General: Normal range of motion.     Cervical back: Normal range of motion.  Skin:    General: Skin is warm and dry.  Neurological:     Mental Status: She is alert and oriented to person, place, and time.     Coordination: Coordination normal.  Psychiatric:        Behavior: Behavior normal. Behavior is cooperative.        Thought Content: Thought content normal.        Judgment: Judgment normal.          Patient has been counseled extensively about nutrition and exercise as well as the importance of adherence with medications and regular follow-up. The patient was given clear instructions to go to ER or return to medical center if symptoms don't improve, worsen or new problems develop. The patient verbalized understanding.   Follow-up: Return in about 6 months (around 08/21/2023).   Claiborne Rigg, FNP-BC [facility] [place], [place] [phone]   02/21/2023,  1:34 PM

## 2023-02-21 NOTE — Progress Notes (Signed)
Tingling on left side of back.  Palpitations  Headache

## 2023-02-21 NOTE — Patient Instructions (Addendum)
Call Cardiology to schedule Mother having palpitations [facility] Address: [address] [place], [place] [address] Phone: [phone]   Needs Copper Sleeves for knee pain. You can buy these on [place] or purchase from grocery stores

## 2023-02-22 LAB — CBC WITH DIFFERENTIAL/PLATELET
Basophils Absolute: 0 10*3/uL (ref 0.0–0.2)
Basos: 0 %
EOS (ABSOLUTE): 0.2 10*3/uL (ref 0.0–0.4)
Eos: 3 %
Hematocrit: 40.8 % (ref 34.0–46.6)
Hemoglobin: 13.4 g/dL (ref 11.1–15.9)
Immature Grans (Abs): 0 10*3/uL (ref 0.0–0.1)
Immature Granulocytes: 0 %
Lymphocytes Absolute: 2.4 10*3/uL (ref 0.7–3.1)
Lymphs: 39 %
MCH: 30.9 pg (ref 26.6–33.0)
MCHC: 32.8 g/dL (ref 31.5–35.7)
MCV: 94 fL (ref 79–97)
Monocytes Absolute: 0.4 10*3/uL (ref 0.1–0.9)
Monocytes: 6 %
Neutrophils Absolute: 3.1 10*3/uL (ref 1.4–7.0)
Neutrophils: 52 %
Platelets: 307 10*3/uL (ref 150–450)
RBC: 4.33 x10E6/uL (ref 3.77–5.28)
RDW: 12 % (ref 11.7–15.4)
WBC: 6.1 10*3/uL (ref 3.4–10.8)

## 2023-02-22 LAB — CMP14+EGFR
ALT: 21 [IU]/L (ref 0–32)
AST: 26 [IU]/L (ref 0–40)
Albumin: 4.6 g/dL (ref 3.7–4.7)
Alkaline Phosphatase: 138 [IU]/L — ABNORMAL HIGH (ref 44–121)
BUN/Creatinine Ratio: 20 (ref 12–28)
BUN: 18 mg/dL (ref 8–27)
Bilirubin Total: 0.3 mg/dL (ref 0.0–1.2)
CO2: 21 mmol/L (ref 20–29)
Calcium: 9.4 mg/dL (ref 8.7–10.3)
Chloride: 106 mmol/L (ref 96–106)
Creatinine, Ser: 0.88 mg/dL (ref 0.57–1.00)
Globulin, Total: 2.5 g/dL (ref 1.5–4.5)
Glucose: 100 mg/dL — ABNORMAL HIGH (ref 70–99)
Potassium: 4.5 mmol/L (ref 3.5–5.2)
Sodium: 143 mmol/L (ref 134–144)
Total Protein: 7.1 g/dL (ref 6.0–8.5)
eGFR: 64 mL/min/{1.73_m2} (ref >59)

## 2023-02-22 LAB — VITAMIN D 25 HYDROXY (VIT D DEFICIENCY, FRACTURES): Vit D, 25-Hydroxy: 38.6 ng/mL (ref 30.0–100.0)

## 2023-08-22 ENCOUNTER — Ambulatory Visit: Payer: Medicare Other | Admitting: Nurse Practitioner

## 2023-09-19 ENCOUNTER — Ambulatory Visit: Attending: Nurse Practitioner | Admitting: Nurse Practitioner

## 2023-09-19 VITALS — BP 176/77 | HR 69 | Resp 19 | Ht 62.0 in | Wt 128.0 lb

## 2023-09-19 DIAGNOSIS — Z79899 Other long term (current) drug therapy: Secondary | ICD-10-CM | POA: Insufficient documentation

## 2023-09-19 DIAGNOSIS — I10 Essential (primary) hypertension: Secondary | ICD-10-CM | POA: Diagnosis not present

## 2023-09-19 DIAGNOSIS — M7989 Other specified soft tissue disorders: Secondary | ICD-10-CM | POA: Insufficient documentation

## 2023-09-19 DIAGNOSIS — E785 Hyperlipidemia, unspecified: Secondary | ICD-10-CM | POA: Diagnosis not present

## 2023-09-19 DIAGNOSIS — R519 Headache, unspecified: Secondary | ICD-10-CM | POA: Diagnosis not present

## 2023-09-19 MED ORDER — SIMVASTATIN 10 MG PO TABS: 10.0000 mg | ORAL_TABLET | Freq: Every day | ORAL | 3 refills | Status: AC

## 2023-09-19 MED ORDER — VALSARTAN-HYDROCHLOROTHIAZIDE 80-12.5 MG PO TABS: 1.0000 | ORAL_TABLET | Freq: Every day | ORAL | 3 refills | Status: AC

## 2023-09-19 MED ORDER — ACETAMINOPHEN 500 MG PO TABS: 500.0000 mg | ORAL_TABLET | Freq: Four times a day (QID) | ORAL | 0 refills | Status: AC | PRN

## 2023-09-19 NOTE — Progress Notes (Signed)
 Assessment & Plan:  Jo Brown was seen today for hypertension.  Diagnoses and all orders for this visit:  Primary hypertension DOSE CHANGE> STOP AMLODIPINE . START DIOVAN  HCT -     Urinalysis, Complete -     CMP14+EGFR -     valsartan -hydrochlorothiazide  (DIOVAN -HCT) 80-12.5 MG tablet; Take 1 tablet by mouth daily. -     Basic metabolic panel with GFR; Future Continue all antihypertensives as prescribed.  Reminded to bring in blood pressure device and log for follow  up appointment.  RECOMMENDATIONS: DASH/Mediterranean Diets are healthier choices for HTN.    Localized swelling of lower extremity -     Urinalysis, Complete -     Sedimentation Rate -     C-reactive protein -     ANA w/Reflex  Frequent headaches STOP AMLODIPINE  AVOID CAFFEINE TYLENOL XS prescribed -     Ambulatory referral to Neurology -     Sedimentation Rate -     C-reactive protein -     ANA w/Reflex  Hyperlipidemia, unspecified hyperlipidemia type -     Lipid panel -     simvastatin  (ZOCOR ) 10 MG tablet; Take 1 tablet (10 mg total) by mouth daily at 6 PM. INSTRUCTIONS: Work on a low fat, heart healthy diet and participate in regular aerobic exercise program by working out at least 150 minutes per week; 5 days a week-30 minutes per day. Avoid red meat/beef/steak,  fried foods. junk foods, sodas, sugary drinks, unhealthy snacking, alcohol and smoking.  Drink at least 80 oz of water per day and monitor your carbohydrate intake daily.     Patient has been counseled on age-appropriate routine health concerns for screening and prevention. These are reviewed and up-to-date. Referrals have been placed accordingly. Immunizations are up-to-date or declined.    Subjective:   Chief Complaint  Patient presents with   Hypertension    Jo Brown 89 y.o. female presents to office today for follow up to HTN  VRI was used to communicate directly with patient for the entire encounter including providing detailed patient  instructions.     HTN  Blood pressure is elevated today. She reports normal readings at home but does not have her log with her today. Endorses mild BLE swelling.  BP Readings from Last 3 Encounters:  09/19/23 (!) 176/77  02/21/23 (!) 180/72  10/13/22 138/84     She is experiencing headaches everyday several times per day. Headaches last a few minutes at a time. No associating factors. Pain is described as stabbing or poking. She reports a history of migraines. She states she was seen by the "headache doctor" and they did imaging but did not find anything abnormal. She is also taking amlodipine  5mg  daily.      Review of Systems  Constitutional:  Negative for fever, malaise/fatigue and weight loss.  HENT: Negative.  Negative for nosebleeds.   Eyes: Negative.  Negative for blurred vision, double vision and photophobia.  Respiratory: Negative.  Negative for cough and shortness of breath.   Cardiovascular:  Positive for leg swelling. Negative for chest pain and palpitations.  Gastrointestinal: Negative.  Negative for heartburn, nausea and vomiting.  Musculoskeletal: Negative.  Negative for myalgias.  Neurological:  Positive for headaches. Negative for dizziness, focal weakness and seizures.  Psychiatric/Behavioral: Negative.  Negative for suicidal ideas.     Past Medical History:  Diagnosis Date   Hypertension     Past Surgical History:  Procedure Laterality Date   EYE SURGERY  removal of colon polyps      Family History  Problem Relation Age of Onset   Healthy Neg Hx     Social History Reviewed with no changes to be made today.   Outpatient Medications Prior to Visit  Medication Sig Dispense Refill   latanoprost  (XALATAN ) 0.005 % ophthalmic solution INSTILL 1 DROP IN BOTH EYES AT BEDTIME 7.5 mL 1   amLODipine  (NORVASC ) 5 MG tablet Take 1 tablet (5 mg total) by mouth daily. 90 tablet 1   simvastatin  (ZOCOR ) 10 MG tablet Take 1 tablet (10 mg total) by mouth daily at 6  PM. 90 tablet 3   valsartan  (DIOVAN ) 80 MG tablet Take 1 tablet (80 mg total) by mouth daily. 90 tablet 1   diclofenac  Sodium (VOLTAREN ) 1 % GEL Apply 4 g topically 4 (four) times daily. (Patient not taking: Reported on 09/19/2023) 200 g 3   No facility-administered medications prior to visit.    No Known Allergies     Objective:    BP (!) 176/77   Pulse 69   Resp 19   Ht 5\' 2"  (1.575 m)   Wt 128 lb (58.1 kg)   SpO2 100%   BMI 23.41 kg/m  Wt Readings from Last 3 Encounters:  09/19/23 128 lb (58.1 kg)  02/21/23 127 lb (57.6 kg)  10/13/22 130 lb (59 kg)    Physical Exam Vitals and nursing note reviewed.  Constitutional:      Appearance: She is well-developed.  HENT:     Head: Normocephalic and atraumatic.  Cardiovascular:     Rate and Rhythm: Normal rate and regular rhythm.     Heart sounds: Normal heart sounds. No murmur heard.    No friction rub. No gallop.  Pulmonary:     Effort: Pulmonary effort is normal. No tachypnea or respiratory distress.     Breath sounds: Normal breath sounds. No decreased breath sounds, wheezing, rhonchi or rales.  Chest:     Chest wall: No tenderness.  Abdominal:     General: Bowel sounds are normal.     Palpations: Abdomen is soft.  Musculoskeletal:        General: Normal range of motion.     Cervical back: Normal range of motion.     Right ankle: Swelling present.     Left ankle: Swelling present.  Skin:    General: Skin is warm and dry.  Neurological:     Mental Status: She is alert and oriented to person, place, and time.     Coordination: Coordination normal.  Psychiatric:        Behavior: Behavior normal. Behavior is cooperative.        Thought Content: Thought content normal.        Judgment: Judgment normal.          Patient has been counseled extensively about nutrition and exercise as well as the importance of adherence with medications and regular follow-up. The patient was given clear instructions to go to ER or  return to medical center if symptoms don't improve, worsen or new problems develop. The patient verbalized understanding.   Follow-up: Return in about 3 weeks (around 10/10/2023) for 3 WEEKS BP CHECK WITH LUKE AND BMP. SEE ME IN 6 MONTHS FOR PHYSICAL.   Collins Dean, FNP-BC [facility] [place], [place] [phone]   09/19/2023, 10:05 AM

## 2023-09-19 NOTE — Patient Instructions (Addendum)
 [facility] Address: [address]  [place], [place] [address] Phone: [phone]   [facility] [address]   [phone]

## 2023-09-21 ENCOUNTER — Ambulatory Visit: Payer: Self-pay | Admitting: Nurse Practitioner

## 2023-09-27 LAB — URINALYSIS, COMPLETE
Bilirubin, UA: NEGATIVE
Glucose, UA: NEGATIVE
Ketones, UA: NEGATIVE
Leukocytes,UA: NEGATIVE
Nitrite, UA: NEGATIVE
Protein,UA: NEGATIVE
RBC, UA: NEGATIVE
Specific Gravity, UA: 1.013 (ref 1.005–1.030)
Urobilinogen, Ur: 0.2 mg/dL (ref 0.2–1.0)
pH, UA: 7 (ref 5.0–7.5)

## 2023-09-27 LAB — C-REACTIVE PROTEIN: CRP: <1 mg/L (ref 0–10)

## 2023-09-27 LAB — CMP14+EGFR
ALT: 20 IU/L (ref 0–32)
AST: 25 IU/L (ref 0–40)
Albumin: 4.4 g/dL (ref 3.7–4.7)
Alkaline Phosphatase: 155 IU/L — ABNORMAL HIGH (ref 44–121)
BUN/Creatinine Ratio: 15 (ref 12–28)
BUN: 13 mg/dL (ref 8–27)
Bilirubin Total: 0.4 mg/dL (ref 0.0–1.2)
CO2: 19 mmol/L — ABNORMAL LOW (ref 20–29)
Calcium: 9.1 mg/dL (ref 8.7–10.3)
Chloride: 106 mmol/L (ref 96–106)
Creatinine, Ser: 0.86 mg/dL (ref 0.57–1.00)
Globulin, Total: 2.3 g/dL (ref 1.5–4.5)
Glucose: 97 mg/dL (ref 70–99)
Potassium: 4.4 mmol/L (ref 3.5–5.2)
Sodium: 144 mmol/L (ref 134–144)
Total Protein: 6.7 g/dL (ref 6.0–8.5)
eGFR: 65 mL/min/{1.73_m2} (ref >59)

## 2023-09-27 LAB — LIPID PANEL
Chol/HDL Ratio: 3 ratio (ref 0.0–4.4)
Cholesterol, Total: 169 mg/dL (ref 100–199)
HDL: 56 mg/dL (ref >39)
LDL Chol Calc (NIH): 89 mg/dL (ref 0–99)
Triglycerides: 139 mg/dL (ref 0–149)
VLDL Cholesterol Cal: 24 mg/dL (ref 5–40)

## 2023-09-27 LAB — ENA+DNA/DS+ANTICH+CENTRO+FA...
Anti JO-1: <0.2 AI (ref 0.0–0.9)
Antiribosomal P Antibodies: <0.2 AI (ref 0.0–0.9)
Centromere Ab Screen: <0.2 AI (ref 0.0–0.9)
Chromatin Ab SerPl-aCnc: <0.2 AI (ref 0.0–0.9)
ENA RNP Ab: <0.2 AI (ref 0.0–0.9)
ENA SM Ab Ser-aCnc: <0.2 AI (ref 0.0–0.9)
ENA SSA (RO) Ab: <0.2 AI (ref 0.0–0.9)
ENA SSB (LA) Ab: <0.2 AI (ref 0.0–0.9)
Scleroderma (Scl-70) (ENA) Antibody, IgG: 1.2 AI — ABNORMAL HIGH (ref 0.0–0.9)
Smith/RNP Antibodies: <0.2 AI (ref 0.0–0.9)
Speckled Pattern: 1:640 {titer} — ABNORMAL HIGH
dsDNA Ab: <1 [IU]/mL (ref 0–9)

## 2023-09-27 LAB — MICROSCOPIC EXAMINATION
Bacteria, UA: NONE SEEN
Casts: NONE SEEN /LPF

## 2023-09-27 LAB — ANA W/REFLEX: ANA Titer 1: POSITIVE — AB

## 2023-09-27 LAB — SEDIMENTATION RATE: Sed Rate: 11 mm/h (ref 0–40)

## 2023-10-03 ENCOUNTER — Other Ambulatory Visit: Payer: Self-pay | Admitting: Nurse Practitioner

## 2023-10-03 DIAGNOSIS — R519 Headache, unspecified: Secondary | ICD-10-CM

## 2023-10-03 DIAGNOSIS — M7989 Other specified soft tissue disorders: Secondary | ICD-10-CM

## 2023-10-14 ENCOUNTER — Ambulatory Visit: Attending: Nurse Practitioner | Admitting: Pharmacist

## 2023-10-14 ENCOUNTER — Encounter: Payer: Self-pay | Admitting: Pharmacist

## 2023-10-14 VITALS — BP 122/69 | HR 84

## 2023-10-14 DIAGNOSIS — I10 Essential (primary) hypertension: Secondary | ICD-10-CM | POA: Insufficient documentation

## 2023-10-14 DIAGNOSIS — E785 Hyperlipidemia, unspecified: Secondary | ICD-10-CM | POA: Diagnosis not present

## 2023-10-14 NOTE — Progress Notes (Signed)
 S:    [employer] interpreter used for this visit - ID #:[id number], Jo Brown   No chief complaint on file.  89 y.o. female who presents for hypertension evaluation, education, and management.  PMH is significant for HTN, HLD, HA.    Patient was referred and last seen by Primary Care Provider, Haze Servant, on 09/19/2023. BP was 176/77 at that visit. Of note, pt has longstanding hx of HTN with whitecoat component. Consistently, her BP will remain high in office but numbers will look better controlled at home. Additionally, she endorsed swelling and HA associated with amlodipine  at her visit with Jo Brown. This was changed to combination valsartan -hydrochlorothiazide .   Today, patient arrives in good spirits and presents without assistance. Denies dizziness, headache, blurred vision, swelling. Patient reports hypertension is longstanding. She brings in a log of her home blood pressures and has a validated home BP machine.   Family/Social history:  Fhx: no pertinent positives Tobacco: never smoker  Alcohol: none reported   Medication adherence reported. Patient has taken BP medications today.   Current antihypertensives include: valsartan -hydrochlorothiazide  80-12.5 mg daily  Antihypertensives tried in the past include: amlodipine  10 mg daily (edema), losartan -HCTZ (HA),   Reported home BP readings:  6/3: 134/75 6/4: 129/76 6/5: 119/67 6/6: 103/67 6/7: 116/66 6/9: 107/61 6/10: 127/74 6/12: 119/70 6/16: 126/70 6/17: 128/68 6/19: 133/78 6/20: 131/66 6/23: 133/69 6/24: 120/72 6/25: 124/69 6/26: 122/69 6/27: 116/70  Avg: 122/69 mmHg  Patient reported dietary habits:  -Sodium: compliant with sodium restriction -Caffeine: only drinks 1 cup of coffee in the morning  Patient-reported exercise habits:  -very active outside and walks daily  O:  Vitals:   10/14/23 1054 10/14/23 1127  BP: (!) 162/79 122/69  Pulse: 84     Last 3 Office BP readings: BP Readings from Last 3 Encounters:   10/14/23 122/69  09/19/23 (!) 176/77  02/21/23 (!) 180/72    BMET    Component Value Date/Time   NA 144 09/19/2023 0915   K 4.4 09/19/2023 0915   CL 106 09/19/2023 0915   CO2 19 (L) 09/19/2023 0915   GLUCOSE 97 09/19/2023 0915   BUN 13 09/19/2023 0915   CREATININE 0.86 09/19/2023 0915   CALCIUM 9.1 09/19/2023 0915   GFRNONAA 54 (L) 06/02/2020 1123   GFRAA 62 06/02/2020 1123    Renal function: CrCl cannot be calculated (Patient's most recent lab result is older than the maximum 21 days allowed.).  Clinical ASCVD: No  The ASCVD Risk score (Arnett DK, et al., 2019) failed to calculate for the following reasons:   The 2019 ASCVD risk score is only valid for ages 66 to 28  Patient is participating in a Managed Medicaid Plan: No   A/P: Hypertension diagnosed currently controlled on current medications. BP goal < 130/80 mmHg. Medication adherence appears appropriate. Even though her clinic BP readings are consistently elevated, she has a BP log demonstrating goal BP readings at home. I have avg'd her BP readings over the last month and have entered this avg as a second BP reading for today's visit.  -Continued current regimen. -Counseled on lifestyle modifications for blood pressure control including reduced dietary sodium, increased exercise, adequate sleep. -Encouraged patient to check BP at home and bring log of readings to next visit. Counseled on proper use of home BP cuff.   Results reviewed and written information provided.    Written patient instructions provided. Patient verbalized understanding of treatment plan.  Total time in face to face counseling 20 minutes.  Follow-up:  Pharmacist prn. PCP clinic visit in 03/26/2024.   Jo Brown, PharmD, Jo Brown, CPP Clinical Pharmacist [facility] [phone]

## 2023-11-04 ENCOUNTER — Other Ambulatory Visit: Payer: Self-pay | Admitting: Nurse Practitioner

## 2023-11-04 DIAGNOSIS — I10 Essential (primary) hypertension: Secondary | ICD-10-CM

## 2024-01-30 ENCOUNTER — Ambulatory Visit: Admitting: Diagnostic Neuroimaging

## 2024-03-08 ENCOUNTER — Encounter: Admitting: Internal Medicine

## 2024-03-08 ENCOUNTER — Encounter: Payer: Self-pay | Admitting: Nurse Practitioner

## 2024-03-08 ENCOUNTER — Ambulatory Visit: Payer: Self-pay | Admitting: Nurse Practitioner

## 2024-03-08 NOTE — Progress Notes (Unsigned)
   Acute Office Visit  Subjective:     Patient ID: Jo Brown, female    DOB: Sep 10, 1933, 89 y.o.   MRN: [id number]  Chief Complaint  Patient presents with  . New Patient (Initial Visit)    Primary    HPI Patient presents today in office accompanied by her daughter. She reports having    ROS      Objective:    BP (!) 142/76 (BP Location: Left Arm, Patient Position: Sitting, Cuff Size: Normal)   Pulse 84   Resp 18   Ht 5' 2 (1.575 m)   Wt 129 lb 12.8 oz (58.9 kg)   SpO2 96%   BMI 23.74 kg/m  {Vitals History (Optional):23777}  Physical Exam  No results found for any visits on 03/08/24.      Assessment & Plan:   Problem List Items Addressed This Visit   None   No orders of the defined types were placed in this encounter.   No follow-ups on file.  Florencia Cousin, NP

## 2024-03-20 ENCOUNTER — Encounter: Admitting: Nurse Practitioner

## 2024-03-26 ENCOUNTER — Encounter: Admitting: Nurse Practitioner

## 2024-04-02 ENCOUNTER — Ambulatory Visit: Admitting: Diagnostic Neuroimaging

## 2024-04-02 ENCOUNTER — Encounter: Payer: Self-pay | Admitting: Diagnostic Neuroimaging

## 2024-04-02 VITALS — BP 148/82 | HR 85 | Ht 62.0 in | Wt 128.8 lb

## 2024-04-02 DIAGNOSIS — G43709 Chronic migraine without aura, not intractable, without status migrainosus: Secondary | ICD-10-CM

## 2024-04-02 DIAGNOSIS — G44209 Tension-type headache, unspecified, not intractable: Secondary | ICD-10-CM

## 2024-04-02 NOTE — Progress Notes (Signed)
 [facility]  PATIENT: Jo Brown DOB: 02-05-1934  REFERRING CLINICIAN: Theotis Haze ORN, NP HISTORY FROM: patient  REASON FOR VISIT: new consult   HISTORICAL  CHIEF COMPLAINT:  Chief Complaint  Patient presents with   RM 7    Patient is here with interpreter for frequent headaches - has been going on for years and with the headaches severe pain - takes 2 does of medication from urgent care (topamax)    HISTORY OF PRESENT ILLNESS:   89 year old female here for evaluation of headaches.  Patient has had headaches for at least last 10 years.  Interestingly there is an MRI from 2012 ordered by headache specialist Dr. Oneita, which she does not remember getting.  She does not remember seeing any other headache specialist in the past.  Patient describes a numb feeling in the head with sharp poking sensations, sometimes nausea and squeezing sensations as well.  No visual aura or scintillating scotoma.  No sensitivity light or sound.  Has some issues with blood pressure elevation.  She is on medication.  Has been put on topiramate 25 mg daily without relief.   REVIEW OF SYSTEMS: Full 14 system review of systems performed and negative with exception of: as per HPI.  ALLERGIES: Allergies[1]  HOME MEDICATIONS: Outpatient Medications Prior to Visit  Medication Sig Dispense Refill   acetaminophen  (TYLENOL ) 500 MG tablet Take 1 tablet (500 mg total) by mouth every 6 (six) hours as needed (FOR HEADACHES). 60 tablet 0   latanoprost  (XALATAN ) 0.005 % ophthalmic solution INSTILL 1 DROP IN BOTH EYES AT BEDTIME 7.5 mL 1   simvastatin  (ZOCOR ) 10 MG tablet Take 1 tablet (10 mg total) by mouth daily at 6 PM. 90 tablet 3   topiramate (TOPAMAX) 25 MG tablet Take by mouth.     valsartan -hydrochlorothiazide  (DIOVAN -HCT) 80-12.5 MG tablet Take 1 tablet by mouth daily. 90 tablet 3   No facility-administered medications prior to visit.    PAST MEDICAL HISTORY: Past Medical  History:  Diagnosis Date   Hypertension     PAST SURGICAL HISTORY: Past Surgical History:  Procedure Laterality Date   EYE SURGERY     removal of colon polyps      FAMILY HISTORY: Family History  Problem Relation Age of Onset   Healthy Neg Hx    Migraines Neg Hx    Seizures Neg Hx    Stroke Neg Hx    Sleep apnea Neg Hx     SOCIAL HISTORY: Social History   Socioeconomic History   Marital status: Married    Spouse name: Not on file   Number of children: Not on file   Years of education: Not on file   Highest education level: GED or equivalent  Occupational History   Not on file  Tobacco Use   Smoking status: Never   Smokeless tobacco: Never  Vaping Use   Vaping status: Never Used  Substance and Sexual Activity   Alcohol use: Never   Drug use: Never   Sexual activity: Not Currently  Other Topics Concern   Not on file  Social History Narrative   ** Merged History Encounter **       Mixed instant coffee drinks    Social Drivers of Health   Tobacco Use: Low Risk (04/02/2024)   Patient History    Smoking Tobacco Use: Never    Smokeless Tobacco Use: Never    Passive Exposure: Not on file  Financial Resource Strain: Low Risk (10/12/2023)  Overall Financial Resource Strain (CARDIA)    Difficulty of Paying Living Expenses: Not hard at all  Food Insecurity: No Food Insecurity (10/12/2023)   Epic    Worried About Programme Researcher, Broadcasting/film/video in the Last Year: Never true    Ran Out of Food in the Last Year: Never true  Transportation Needs: No Transportation Needs (10/12/2023)   Epic    Lack of Transportation (Medical): No    Lack of Transportation (Non-Medical): No  Physical Activity: Insufficiently Active (10/12/2023)   Exercise Vital Sign    Days of Exercise per Week: 3 days    Minutes of Exercise per Session: 40 min  Stress: No Stress Concern Present (10/12/2023)   [employer] of Occupational Health - Occupational Stress Questionnaire    Feeling of Stress: Not  at all  Social Connections: Moderately Integrated (10/12/2023)   Social Connection and Isolation Panel    Frequency of Communication with Friends and Family: Twice a week    Frequency of Social Gatherings with Friends and Family: Three times a week    Attends Religious Services: More than 4 times per year    Active Member of Clubs or Organizations: Yes    Attends Banker Meetings: More than 4 times per year    Marital Status: Widowed  Intimate Partner Violence: Not on file  Depression (PHQ2-9): Low Risk (09/19/2023)   Depression (PHQ2-9)    PHQ-2 Score: 0  Alcohol Screen: Not on file  Housing: Unknown (10/12/2023)   Epic    Unable to Pay for Housing in the Last Year: No    Number of Times Moved in the Last Year: Not on file    Homeless in the Last Year: No  Utilities: Not on file  Health Literacy: Not on file     PHYSICAL EXAM  GENERAL EXAM/CONSTITUTIONAL: Vitals:  Vitals:   04/02/24 1325  BP: (!) 163/85  Pulse: 85  SpO2: 99%  Weight: 128 lb 12.8 oz (58.4 kg)  Height: 5' 2 (1.575 m)   Body mass index is 23.56 kg/m. Wt Readings from Last 3 Encounters:  04/02/24 128 lb 12.8 oz (58.4 kg)  03/08/24 129 lb 12.8 oz (58.9 kg)  09/19/23 128 lb (58.1 kg)   Patient is in no distress; well developed, nourished and groomed; neck is supple  CARDIOVASCULAR: Examination of carotid arteries is normal; no carotid bruits Regular rate and rhythm, no murmurs Examination of peripheral vascular system by observation and palpation is normal  EYES: Ophthalmoscopic exam of optic discs and posterior segments is normal; no papilledema or hemorrhages No results found.  MUSCULOSKELETAL: Gait, strength, tone, movements noted in Neurologic exam below  NEUROLOGIC: MENTAL STATUS:      No data to display         awake, alert, oriented to person, place and time recent and remote memory intact normal attention and concentration language fluent, comprehension intact, naming  intact fund of knowledge appropriate  CRANIAL NERVE:  2nd - no papilledema on fundoscopic exam 2nd, 3rd, 4th, 6th - pupils equal and reactive to light, visual fields full to confrontation, extraocular muscles intact, no nystagmus 5th - facial sensation symmetric 7th - facial strength symmetric 8th - hearing intact 9th - palate elevates symmetrically, uvula midline 11th - shoulder shrug symmetric 12th - tongue protrusion midline  MOTOR:  normal bulk and tone, full strength in the BUE, BLE  SENSORY:  normal and symmetric to light touch, temperature, vibration  COORDINATION:  finger-nose-finger, fine finger movements normal  REFLEXES:  deep tendon reflexes 1+ and symmetric  GAIT/STATION:  narrow based gait     DIAGNOSTIC DATA (LABS, IMAGING, TESTING) - I reviewed patient records, labs, notes, testing and imaging myself where available.  Lab Results  Component Value Date   WBC 6.1 02/21/2023   HGB 13.4 02/21/2023   HCT 40.8 02/21/2023   MCV 94 02/21/2023   PLT 307 02/21/2023      Component Value Date/Time   NA 144 09/19/2023 0915   K 4.4 09/19/2023 0915   CL 106 09/19/2023 0915   CO2 19 (L) 09/19/2023 0915   GLUCOSE 97 09/19/2023 0915   BUN 13 09/19/2023 0915   CREATININE 0.86 09/19/2023 0915   CALCIUM 9.1 09/19/2023 0915   PROT 6.7 09/19/2023 0915   ALBUMIN 4.4 09/19/2023 0915   AST 25 09/19/2023 0915   ALT 20 09/19/2023 0915   ALKPHOS 155 (H) 09/19/2023 0915   BILITOT 0.4 09/19/2023 0915   GFRNONAA 54 (L) 06/02/2020 1123   GFRAA 62 06/02/2020 1123   Lab Results  Component Value Date   CHOL 169 09/19/2023   HDL 56 09/19/2023   LDLCALC 89 09/19/2023   TRIG 139 09/19/2023   CHOLHDL 3.0 09/19/2023   No results found for: HGBA1C No results found for: VITAMINB12 Lab Results  Component Value Date   TSH 1.620 12/15/2020   Lab Results  Component Value Date   ESRSEDRATE 11 09/19/2023   Lab Results  Component Value Date   CRP <1 09/19/2023    04/23/10 MRI brain  1.  No acute intracranial abnormality.  2.  Moderate white matter disease is greater than expected for age.  The finding is nonspecific but can be seen in the setting of  chronic microvascular ischemia, a demyelinating process such as  multiple sclerosis, vasculitis, complicated migraine headaches, or  as the sequelae of a prior infectious or inflammatory process.  3.  Moderate bilateral maxillary sinus disease.    ASSESSMENT AND PLAN  89 y.o. year old female here with chronic headaches since ~2010.   Dx:  1. Chronic migraine without aura without status migrainosus, not intractable   2. Tension headache     PLAN:  CHRONIC HEADACHES (since ~2010; with some migraine features and tension headache features; could be aggravated by BP) - consider increasing topiramate to 50mg  twice a day (as tolerated for migraine prevention) - consider adding beta-blocker (can help with migraine and BP)  Return for return to PCP, pending if symptoms worsen or fail to improve.    EDUARD FABIENE HANLON, MD 04/02/2024, 1:57 PM Certified in Neurology, Neurophysiology and Neuroimaging  [facility] [address] [place], [place] [address] [phone]     [1] No Known Allergies

## 2024-04-02 NOTE — Patient Instructions (Signed)
°  HEADACHES (since ~2010; with some migraine features and tension headache features) - consider increasing topiramate to 50mg  twice a day (as tolerated for migraine prevention) - consider adding beta-blocker (can help with migraine and BP)

## 2024-04-27 ENCOUNTER — Ambulatory Visit: Payer: Self-pay | Admitting: Nurse Practitioner

## 2024-05-01 ENCOUNTER — Ambulatory Visit: Payer: Self-pay | Admitting: Nurse Practitioner

## 2024-05-31 ENCOUNTER — Ambulatory Visit: Payer: Self-pay | Admitting: Nurse Practitioner
